# Patient Record
Sex: Female | Born: 1980 | Race: White | Hispanic: No | Smoking: Current every day smoker
Health system: Southern US, Community
[De-identification: ages and names within clinical notes are randomized; demographics above are authoritative.]

## PROBLEM LIST (undated history)

## (undated) DIAGNOSIS — I10 Essential (primary) hypertension: Secondary | ICD-10-CM

## (undated) DIAGNOSIS — F319 Bipolar disorder, unspecified: Secondary | ICD-10-CM

## (undated) DIAGNOSIS — F259 Schizoaffective disorder, unspecified: Secondary | ICD-10-CM

## (undated) DIAGNOSIS — R569 Unspecified convulsions: Secondary | ICD-10-CM

## (undated) DIAGNOSIS — G43909 Migraine, unspecified, not intractable, without status migrainosus: Secondary | ICD-10-CM

## (undated) DIAGNOSIS — J45909 Unspecified asthma, uncomplicated: Secondary | ICD-10-CM

## (undated) DIAGNOSIS — M797 Fibromyalgia: Secondary | ICD-10-CM

## (undated) DIAGNOSIS — Q282 Arteriovenous malformation of cerebral vessels: Secondary | ICD-10-CM

## (undated) DIAGNOSIS — I639 Cerebral infarction, unspecified: Secondary | ICD-10-CM

## (undated) HISTORY — DX: Schizoaffective disorder, unspecified: F25.9

## (undated) HISTORY — DX: Arteriovenous malformation of cerebral vessels: Q28.2

## (undated) HISTORY — DX: Bipolar disorder, unspecified: F31.9

## (undated) HISTORY — PX: BRAIN SURGERY: SHX531

## (undated) HISTORY — DX: Fibromyalgia: M79.7

## (undated) HISTORY — DX: Migraine, unspecified, not intractable, without status migrainosus: G43.909

---

## 2017-06-11 ENCOUNTER — Other Ambulatory Visit: Payer: Self-pay

## 2017-06-11 ENCOUNTER — Emergency Department (HOSPITAL_COMMUNITY)
Admission: EM | Admit: 2017-06-11 | Discharge: 2017-06-11 | Disposition: A | Discharge status: Home / Self Care | Payer: Medicaid Other | Attending: Emergency Medicine | Admitting: Emergency Medicine

## 2017-06-11 ENCOUNTER — Encounter (HOSPITAL_COMMUNITY): Payer: Self-pay

## 2017-06-11 DIAGNOSIS — I10 Essential (primary) hypertension: Secondary | ICD-10-CM | POA: Diagnosis not present

## 2017-06-11 DIAGNOSIS — Z760 Encounter for issue of repeat prescription: Secondary | ICD-10-CM | POA: Diagnosis not present

## 2017-06-11 DIAGNOSIS — Z79899 Other long term (current) drug therapy: Secondary | ICD-10-CM | POA: Insufficient documentation

## 2017-06-11 DIAGNOSIS — F17200 Nicotine dependence, unspecified, uncomplicated: Secondary | ICD-10-CM | POA: Insufficient documentation

## 2017-06-11 DIAGNOSIS — R569 Unspecified convulsions: Secondary | ICD-10-CM | POA: Insufficient documentation

## 2017-06-11 HISTORY — DX: Essential (primary) hypertension: I10

## 2017-06-11 HISTORY — DX: Unspecified convulsions: R56.9

## 2017-06-11 MED ORDER — GABAPENTIN 300 MG PO CAPS: 300.0000 mg | ORAL_CAPSULE | Freq: Three times a day (TID) | ORAL | 0 refills | Status: DC

## 2017-06-11 MED ORDER — LAMOTRIGINE 100 MG PO TABS: 100.0000 mg | ORAL_TABLET | Freq: Every day | ORAL | 0 refills | Status: DC

## 2017-06-11 MED FILL — lamoTRIgine 100 MG TABS: 100 | 30 days supply | Qty: 30 | Fill #0

## 2017-06-11 MED FILL — GABAPENTIN 300 MG CAPSULE: 300 | 30 days supply | Qty: 90 | Fill #0

## 2017-06-11 NOTE — Discharge Planning (Signed)
Shannon Castillo J. Lucretia RoersWood, RN, BSN, [place] [phone]  [facility] Sindy Messingoger Gomez, PA-C at [facility] on 06/22/17 @ 3:00.  Spoke with pt at bedside and advised to please arrive 15 min early and take a picture ID and your current medications.  Pt verbalizes understanding of keeping appointment.  [place] recently relocated [place] this area from [place]; [facility]; [place] [facility].

## 2017-06-11 NOTE — ED Provider Notes (Signed)
[facility] Provider Note   CSN: [id number] Arrival date & time: 06/11/17  1040     History   Chief Complaint Chief Complaint  Patient presents with  . Medication Refill    HPI Shannon Castillo is a 36 y.o. female who presents to the ED for medication refill. Patient reports that she has been out of her seizure medications for a week. She is supposed to take Lamictal and Neurontin. Patient reports she recently moved here and does not have a PCP yet. Patient denies seizures since being out of her medication.   HPI  Past Medical History:  Diagnosis Date  . Hypertension   . Seizures (HCC)     There are no active problems to display for this patient.   History reviewed. No pertinent surgical history.  OB History    No data available       Home Medications    Prior to Admission medications   Medication Sig Start Date End Date Taking? Authorizing Provider  amLODipine (NORVASC) 5 MG tablet Take 5 mg by mouth daily.   Yes [provider]  hydrOXYzine (ATARAX/VISTARIL) 50 MG tablet Take 50 mg by mouth every 6 (six) hours as needed for anxiety.   Yes [provider]  meloxicam (MOBIC) 15 MG tablet Take 15 mg by mouth 2 (two) times daily.   Yes [provider]  Potassium 75 MG TABS Take 1 tablet by mouth daily.   Yes [provider]  risperiDONE (RISPERDAL) 1 MG tablet Take 1 mg by mouth at bedtime.   Yes [provider]  gabapentin (NEURONTIN) 300 MG capsule Take 1 capsule (300 mg total) by mouth 3 (three) times daily. 06/11/17   Janne NapoleonNeese, Hope M, NP  lamoTRIgine (LAMICTAL) 100 MG tablet Take 1 tablet (100 mg total) by mouth daily. 06/11/17   Janne NapoleonNeese, Hope M, NP    Family History History reviewed. No pertinent family history.  Social History Social History   Tobacco Use  . Smoking status: Current Every Day Smoker    Packs/day: 0.50  Substance Use Topics  . Alcohol use: Yes    Comment: occ   . Drug use: Not on file     Allergies   Penicillins   Review of Systems Review of Systems  All other systems reviewed and are negative. patient denies having any problems but needs medication to prevent having a seizure.   Physical Exam Updated Vital Signs BP 109/75 (BP Location: Right Arm)   Pulse 65   Temp 97.6 F (36.4 C) (Oral)   Resp 16   LMP 06/10/2017 (Within Days)   SpO2 99%   Physical Exam  Constitutional: She is oriented to person, place, and time. She appears well-developed and well-nourished. No distress.  HENT:  Head: Normocephalic.  Eyes: EOM are normal.  Neck: Neck supple.  Cardiovascular: Normal rate.  Pulmonary/Chest: Effort normal.  Musculoskeletal: Normal range of motion.  Neurological: She is alert and oriented to person, place, and time.  Skin: Skin is warm and dry.  Psychiatric: She has a normal mood and affect. Her behavior is normal.  Nursing note and vitals reviewed.    ED Treatments / Results  Labs (all labs ordered are listed, but only abnormal results are displayed) Labs Reviewed - No data to display  Radiology No results found.  Procedures Procedures (including critical care time)  Medications Ordered in ED Medications - No data to display   Initial Impression / Assessment and Plan /  ED Course  I have reviewed the triage vital signs and the nursing notes. 36 [place]. [place] medication refill until she can see a PCP. Medication is for seizures. Medication refilled and patient given referral for PCP. Stable for d/c   Final Clinical Impressions(s) / ED Diagnoses   Final diagnoses:  Medication refill    ED Discharge Orders        Ordered    gabapentin (NEURONTIN) 300 MG capsule  3 times daily     06/11/17 1156    lamoTRIgine (LAMICTAL) 100 MG tablet  Daily     06/11/17 [address]       Neese, [place] M, NP 06/11/17 1827    Mancel BaleWentz, Elliott, MD 06/12/17 1921

## 2017-06-11 NOTE — Discharge Instructions (Signed)
[facility] to schedule follow up.

## 2017-06-11 NOTE — ED Triage Notes (Signed)
Pt states she is out of her seizure medications. States she takes lamictal and neurontin. Pt sates she just recently moved here and does not have a PCP to get her medications. Pt denies any seizures. Has been out of meds X1 week.

## 2017-06-22 ENCOUNTER — Ambulatory Visit (INDEPENDENT_AMBULATORY_CARE_PROVIDER_SITE_OTHER): Payer: Medicaid Other | Admitting: Physician Assistant

## 2017-07-13 ENCOUNTER — Other Ambulatory Visit: Payer: Self-pay

## 2017-07-13 ENCOUNTER — Emergency Department (HOSPITAL_COMMUNITY): Payer: Medicaid Other

## 2017-07-13 ENCOUNTER — Emergency Department (HOSPITAL_COMMUNITY)
Admission: EM | Admit: 2017-07-13 | Discharge: 2017-07-13 | Disposition: A | Discharge status: Home / Self Care | Payer: Medicaid Other | Attending: Emergency Medicine | Admitting: Emergency Medicine

## 2017-07-13 ENCOUNTER — Encounter (HOSPITAL_COMMUNITY): Payer: Self-pay

## 2017-07-13 DIAGNOSIS — J069 Acute upper respiratory infection, unspecified: Secondary | ICD-10-CM | POA: Insufficient documentation

## 2017-07-13 DIAGNOSIS — F17210 Nicotine dependence, cigarettes, uncomplicated: Secondary | ICD-10-CM | POA: Insufficient documentation

## 2017-07-13 DIAGNOSIS — I10 Essential (primary) hypertension: Secondary | ICD-10-CM | POA: Diagnosis not present

## 2017-07-13 DIAGNOSIS — Z79899 Other long term (current) drug therapy: Secondary | ICD-10-CM | POA: Insufficient documentation

## 2017-07-13 DIAGNOSIS — Z760 Encounter for issue of repeat prescription: Secondary | ICD-10-CM | POA: Insufficient documentation

## 2017-07-13 DIAGNOSIS — R05 Cough: Secondary | ICD-10-CM | POA: Diagnosis present

## 2017-07-13 MED ORDER — RISPERIDONE 1 MG PO TABS: 1.0000 mg | ORAL_TABLET | Freq: Every day | ORAL | 0 refills | Status: DC

## 2017-07-13 MED ORDER — LAMOTRIGINE 100 MG PO TABS: 100.0000 mg | ORAL_TABLET | Freq: Every day | ORAL | 0 refills | Status: DC

## 2017-07-13 MED ORDER — GABAPENTIN 300 MG PO CAPS: 300.0000 mg | ORAL_CAPSULE | Freq: Three times a day (TID) | ORAL | 0 refills | Status: DC

## 2017-07-13 MED ORDER — PROMETHAZINE-DM 6.25-15 MG/5ML PO SYRP: 5.0000 mL | ORAL_SOLUTION | Freq: Four times a day (QID) | ORAL | 0 refills | Status: DC | PRN

## 2017-07-13 MED ORDER — DEXAMETHASONE SODIUM PHOSPHATE 10 MG/ML IJ SOLN
10.0000 mg | Freq: Once | INTRAMUSCULAR | Status: AC
  Administered 2017-07-13: 10 mg via INTRAMUSCULAR
  Filled 2017-07-13: qty 1

## 2017-07-13 MED ORDER — FLUTICASONE PROPIONATE 50 MCG/ACT NA SUSP: 2.0000 | Freq: Every day | NASAL | 0 refills | Status: DC

## 2017-07-13 MED ORDER — IPRATROPIUM-ALBUTEROL 0.5-2.5 (3) MG/3ML IN SOLN
3.0000 mL | Freq: Once | RESPIRATORY_TRACT | Status: AC
Start: 2017-07-13 — End: 2017-07-13
  Administered 2017-07-13: 3 mL via RESPIRATORY_TRACT
  Filled 2017-07-13: qty 3

## 2017-07-13 NOTE — ED Provider Notes (Signed)
[facility] EMERGENCY DEPARTMENT Provider Note   CSN: [id number] Arrival date & time: 07/13/17  1551     History   Chief Complaint Chief Complaint  Patient presents with  . Cough  . Medication Refill    HPI Shannon Castillo is a 37 y.o. female presenting for evaluation of cough, nasal congestion, and fevers.  She states that for the past week, she has had URI symptoms including cough, nasal congestion, and fevers.  Initially she had fevers every day, but this has improved in the past several days.  Her cough is productive, worse at night.  She took NyQuil without improvement of symptoms.  She has a history of asthma, has been using her inhaler more often than normal, but reports no improvement of her symptoms after inhaler use.  She is concerned she might have pneumonia.  Her husband has similar URI symptoms.  She denies sinus pain or pressure, ear pain, sore throat, chest pain, difficulty breathing, nausea, vomiting, abdominal pain. Additionally, patient reports she needs a refill of her antiseizure medicines.  This includes gabapentin, Risperdal, and Lamictal.  She recently moved from [place] couple months ago.  She had these refilled last month in the ER, an appointment was set up with R Lily Kocher, PA-C, but she states she lost her paperwork and could not go.  She states she does not need any of her other medicines refilled.  She denies any seizures, ran out of her medicines today.  HPI  Past Medical History:  Diagnosis Date  . Hypertension   . Seizures (HCC)     There are no active problems to display for this patient.   History reviewed. No pertinent surgical history.  OB History    No data available       Home Medications    Prior to Admission medications   Medication Sig Start Date End Date Taking? Authorizing Provider  amLODipine (NORVASC) 5 MG tablet Take 5 mg by mouth daily.    [provider]  fluticasone (FLONASE) 50 MCG/ACT nasal  spray Place 2 sprays into both nostrils daily. 07/13/17   Lasharn Bufkin, PA-C  gabapentin (NEURONTIN) 300 MG capsule Take 1 capsule (300 mg total) by mouth 3 (three) times daily for 14 days. 07/13/17 07/27/17  Dereonna Lensing, PA-C  hydrOXYzine (ATARAX/VISTARIL) 50 MG tablet Take 50 mg by mouth every 6 (six) hours as needed for anxiety.    [provider]  lamoTRIgine (LAMICTAL) 100 MG tablet Take 1 tablet (100 mg total) by mouth daily for 14 days. 07/13/17 07/27/17  Bassheva Flury, PA-C  meloxicam (MOBIC) 15 MG tablet Take 15 mg by mouth 2 (two) times daily.    [provider]  Potassium 75 MG TABS Take 1 tablet by mouth daily.    [provider]  promethazine-dextromethorphan (PROMETHAZINE-DM) 6.25-15 MG/5ML syrup Take 5 mLs by mouth 4 (four) times daily as needed for cough. 07/13/17   Teodor Prater, PA-C  risperiDONE (RISPERDAL) 1 MG tablet Take 1 tablet (1 mg total) by mouth at bedtime for 14 days. 07/13/17 07/27/17  Ronrico Dupin, PA-C    Family History History reviewed. No pertinent family history.  Social History Social History   Tobacco Use  . Smoking status: Current Every Day Smoker    Packs/day: 0.50  . Smokeless tobacco: Never Used  Substance Use Topics  . Alcohol use: Yes    Comment: occ  . Drug use: Not on file     Allergies   Penicillins  Review of Systems Review of Systems  Constitutional: Positive for fever (improving).  HENT: Positive for congestion. Negative for sinus pressure, sinus pain and tinnitus.   Eyes: Negative for pain and itching.  Respiratory: Positive for cough. Negative for shortness of breath.   Cardiovascular: Negative for chest pain.  Gastrointestinal: Negative for abdominal pain, nausea and vomiting.  Genitourinary: Negative for dysuria, frequency and hematuria.  Musculoskeletal: Negative for back pain.  Allergic/Immunologic: Negative for immunocompromised state.  Neurological: Negative for dizziness.    Hematological: Does not bruise/bleed easily.  Psychiatric/Behavioral: Negative for confusion.     Physical Exam Updated Vital Signs BP 122/85 (BP Location: Right Arm)   Pulse 73   Temp 98 F (36.7 C) (Oral)   Resp 18   Ht 5\' 4"  (1.626 m)   Wt 80.3 kg (177 lb)   LMP 07/13/2017   SpO2 98%   BMI 30.38 kg/m   Physical Exam  Constitutional: She is oriented to person, place, and time. She appears well-developed and well-nourished. No distress.  HENT:  Head: Normocephalic and atraumatic.  Right Ear: Tympanic membrane, external ear and ear canal normal.  Left Ear: Tympanic membrane, external ear and ear canal normal.  Nose: Mucosal edema present. Right sinus exhibits no maxillary sinus tenderness and no frontal sinus tenderness. Left sinus exhibits no maxillary sinus tenderness and no frontal sinus tenderness.  Mouth/Throat: Uvula is midline, oropharynx is clear and moist and mucous membranes are normal. No tonsillar exudate.  Nasal mucosal edema.  OP clear without tonsillar swelling or exudate.  TMs nonerythematous and not bulging bilaterally.  No sinus pain or pressure.  Eyes: Conjunctivae and EOM are normal. Pupils are equal, round, and reactive to light.  Neck: Normal range of motion.  Cardiovascular: Normal rate, regular rhythm and intact distal pulses.  Pulmonary/Chest: Effort normal. She has no decreased breath sounds. She has wheezes. She has no rhonchi. She has no rales.  Pt speaking in full sentences without difficulty.  Wheezes heard in all fields.  No rales or rhonchi.  Good air movement.  Abdominal: Soft. She exhibits no distension. There is no tenderness.  Musculoskeletal: Normal range of motion.  Lymphadenopathy:    She has no cervical adenopathy.  Neurological: She is alert and oriented to person, place, and time.  Skin: Skin is warm.  Psychiatric: She has a normal mood and affect.  Nursing note and vitals reviewed.    ED Treatments / Results  Labs (all labs  ordered are listed, but only abnormal results are displayed) Labs Reviewed - No data to display  EKG  EKG Interpretation None       Radiology Dg Chest 2 View  Result Date: 07/13/2017 CLINICAL DATA:  Cough, fever EXAM: CHEST  2 VIEW COMPARISON:  None. FINDINGS: Heart and mediastinal contours are within normal limits. No focal opacities or effusions. No acute bony abnormality. IMPRESSION: No active cardiopulmonary disease. Electronically Signed   By: Charlett NoseKevin  Dover M.D.   On: 07/13/2017 19:07    Procedures Procedures (including critical care time)  Medications Ordered in ED Medications  ipratropium-albuterol (DUONEB) 0.5-2.5 (3) MG/3ML nebulizer solution 3 mL (3 mLs Nebulization Given 07/13/17 1750)  dexamethasone (DECADRON) injection 10 mg (10 mg Intramuscular Given 07/13/17 1932)     Initial Impression / Assessment and Plan / ED Course  I have reviewed the triage vital signs and the nursing notes.  Pertinent labs & imaging results that were available during my care of the patient were reviewed by me and considered in  my medical decision making (see chart for details).     Patient presenting with 1 wk URI symptoms.  Physical exam reassuring, patient is afebrile and appears nontoxic.  Pulmonary exam reassuring with scattered wheezing.  Doubt pneumonia, strep, other bacterial infection, or peritonsillar abscess. CXR negative.  Likely viral URI.  Wheezing resolved with duoneb tx, pt reports no change in sxs.  Patient requests Decadron shot without prednisone prescription.  Will d/c with symptomatic tx. additionally, patient requesting refills of her antiseizure medications.  Discussed that the ER is not a place for medication refill, however I will refill for 2 weeks to ensure she does not have a seizure.  Stressed importance of primary care follow-up.  At this time, patient appears safe for discharge.  Return precautions given.  Patient states she understands and agrees to plan.   Final  Clinical Impressions(s) / ED Diagnoses   Final diagnoses:  Upper respiratory tract infection, unspecified type    ED Discharge Orders        Ordered    fluticasone (FLONASE) 50 MCG/ACT nasal spray  Daily     07/13/17 1945    promethazine-dextromethorphan (PROMETHAZINE-DM) 6.25-15 MG/5ML syrup  4 times daily PRN     07/13/17 1945    gabapentin (NEURONTIN) 300 MG capsule  3 times daily     07/13/17 1945    lamoTRIgine (LAMICTAL) 100 MG tablet  Daily     07/13/17 1945    risperiDONE (RISPERDAL) 1 MG tablet  Daily at bedtime     07/13/17 1945       Alveria Apley, PA-C 07/13/17 2004    Nira Conn, MD 07/13/17 2109

## 2017-07-13 NOTE — Discharge Instructions (Signed)
It is very important that you establish primary care. There is information below for 2 clinics, or you may contact the [phone] number in the back of the paperwork to help.  You likely have a viral illness.  This should be treated symptomatically. Use Tylenol or ibuprofen as needed for fevers or body aches. Use Flonase daily for nasal congestion and cough. Use you inhaler every 4 hours for the next 2 days.  Use cough syrup as needed.  Make sure you stay well-hydrated with water. Wash your hands frequently to prevent spread of infection. Follow-up with your primary care doctor in 1 week if your symptoms are not improving. Return to the emergency room if you develop chest pain, difficulty breathing, or any new or worsening symptoms.

## 2017-07-13 NOTE — ED Notes (Signed)
Pt to xray

## 2017-07-13 NOTE — ED Triage Notes (Signed)
Pt states she has had a cold. Cough, and congestion. States coughing up mucous. She states she is also out of her medications, lamictal, neurontin, risperdone.

## 2017-08-21 ENCOUNTER — Other Ambulatory Visit: Payer: Self-pay

## 2017-08-21 ENCOUNTER — Emergency Department (HOSPITAL_COMMUNITY)
Admission: EM | Admit: 2017-08-21 | Discharge: 2017-08-21 | Disposition: A | Discharge status: Home / Self Care | Payer: Medicaid Other | Attending: Emergency Medicine | Admitting: Emergency Medicine

## 2017-08-21 ENCOUNTER — Emergency Department (HOSPITAL_COMMUNITY): Payer: Medicaid Other

## 2017-08-21 ENCOUNTER — Encounter (HOSPITAL_COMMUNITY): Payer: Self-pay | Admitting: Emergency Medicine

## 2017-08-21 DIAGNOSIS — I10 Essential (primary) hypertension: Secondary | ICD-10-CM | POA: Diagnosis not present

## 2017-08-21 DIAGNOSIS — J40 Bronchitis, not specified as acute or chronic: Secondary | ICD-10-CM | POA: Diagnosis not present

## 2017-08-21 DIAGNOSIS — F17200 Nicotine dependence, unspecified, uncomplicated: Secondary | ICD-10-CM | POA: Diagnosis not present

## 2017-08-21 DIAGNOSIS — Z79899 Other long term (current) drug therapy: Secondary | ICD-10-CM | POA: Diagnosis not present

## 2017-08-21 DIAGNOSIS — R05 Cough: Secondary | ICD-10-CM | POA: Diagnosis present

## 2017-08-21 MED ORDER — AEROCHAMBER Z-STAT PLUS/MEDIUM MISC
1.0000 | Freq: Once | Status: AC
  Administered 2017-08-21: 1
  Filled 2017-08-21 (×2): qty 1

## 2017-08-21 MED ORDER — PREDNISONE 10 MG (21) PO TBPK: ORAL_TABLET | Freq: Every day | ORAL | 0 refills | Status: DC

## 2017-08-21 MED ORDER — BENZONATATE 100 MG PO CAPS: 100.0000 mg | ORAL_CAPSULE | Freq: Three times a day (TID) | ORAL | 0 refills | Status: DC

## 2017-08-21 NOTE — Discharge Instructions (Signed)
Use inhaler with AeroChamber every 4 hours.  Take prednisone as prescribed until all gone.  Continue Flonase nasal spray daily.  Take Tessalon as needed for cough.  Please follow-up with family doctor if not improving in 3 days.

## 2017-08-21 NOTE — ED Triage Notes (Signed)
Pt to ER for re-evaluation of URI symptoms x1 month. States was given antibiotics and it has not subsided. States productive cough and nasal congestion.

## 2017-08-21 NOTE — ED Provider Notes (Signed)
[facility] EMERGENCY DEPARTMENT Provider Note   CSN: [id number] Arrival date & time: 08/21/17  1046     History   Chief Complaint Chief Complaint  Patient presents with  . URI    HPI Shannon Castillo is a 37 y.o. female.  HPI Shannon Castillo is a 37 y.o. female presents to emergency department with complaint of nasal congestion, cough, shortness of breath.  Patient states her symptoms initially began about 1 month ago.  She was initially seen here when her symptoms began on 07/13/17.  She was prescribed nasal spray, Flonase, and promethazine DM for cough.  She states since then she has seen her primary care doctor who put her on a course of "strong antibiotic" which she finished about 2 weeks ago.  She states nothing she is doing is helping.  She continues to have productive cough.  She states she is having trouble sleeping due to cough and shortness of breath.  She continues to have nasal congestion.  She denies any fever or chills.  No sore throat.  No nausea, vomiting, diarrhea.  She is currently not taking any medicines other than inhalers which she uses twice a day.  Past Medical History:  Diagnosis Date  . Hypertension   . Seizures (HCC)     There are no active problems to display for this patient.   History reviewed. No pertinent surgical history.  OB History    No data available       Home Medications    Prior to Admission medications   Medication Sig Start Date End Date Taking? Authorizing Provider  amLODipine (NORVASC) 5 MG tablet Take 5 mg by mouth daily.    [provider]  fluticasone (FLONASE) 50 MCG/ACT nasal spray Place 2 sprays into both nostrils daily. 07/13/17   Caccavale, Sophia, PA-C  gabapentin (NEURONTIN) 300 MG capsule Take 1 capsule (300 mg total) by mouth 3 (three) times daily for 14 days. 07/13/17 07/27/17  Caccavale, Sophia, PA-C  hydrOXYzine (ATARAX/VISTARIL) 50 MG tablet Take 50 mg by mouth every 6 (six) hours as needed  for anxiety.    [provider]  lamoTRIgine (LAMICTAL) 100 MG tablet Take 1 tablet (100 mg total) by mouth daily for 14 days. 07/13/17 07/27/17  Caccavale, Sophia, PA-C  meloxicam (MOBIC) 15 MG tablet Take 15 mg by mouth 2 (two) times daily.    [provider]  Potassium 75 MG TABS Take 1 tablet by mouth daily.    [provider]  promethazine-dextromethorphan (PROMETHAZINE-DM) 6.25-15 MG/5ML syrup Take 5 mLs by mouth 4 (four) times daily as needed for cough. 07/13/17   Caccavale, Sophia, PA-C  risperiDONE (RISPERDAL) 1 MG tablet Take 1 tablet (1 mg total) by mouth at bedtime for 14 days. 07/13/17 07/27/17  Caccavale, Sophia, PA-C    Family History History reviewed. No pertinent family history.  Social History Social History   Tobacco Use  . Smoking status: Current Every Day Smoker    Packs/day: 0.50  . Smokeless tobacco: Never Used  Substance Use Topics  . Alcohol use: Yes    Comment: occ  . Drug use: Not on file     Allergies   Penicillins   Review of Systems Review of Systems  Constitutional: Negative for chills and fever.  HENT: Positive for congestion and sore throat.   Respiratory: Positive for cough and shortness of breath. Negative for chest tightness.   Cardiovascular: Negative for chest pain, palpitations and leg swelling.  Gastrointestinal: Negative for abdominal  pain, diarrhea, nausea and vomiting.  Genitourinary: Negative for dysuria, flank pain and pelvic pain.  Musculoskeletal: Negative for arthralgias, myalgias, neck pain and neck stiffness.  Skin: Negative for rash.  Neurological: Negative for dizziness, weakness and headaches.  All other systems reviewed and are negative.    Physical Exam Updated Vital Signs BP 122/85 (BP Location: Right Arm)   Pulse 84   Temp 97.8 F (36.6 C) (Oral)   Resp 16   Ht 5\' 4"  (1.626 m)   Wt 81.6 kg (180 lb)   LMP 08/03/2017 (Approximate)   SpO2 98%   BMI 30.90 kg/m   Physical Exam    Constitutional: She is oriented to person, place, and time. She appears well-developed and well-nourished. No distress.  HENT:  Head: Normocephalic.  Mouth/Throat: Oropharynx is clear and moist.  Nasal congestion present  Eyes: Conjunctivae are normal.  Neck: Neck supple.  Cardiovascular: Normal rate, regular rhythm and normal heart sounds.  Pulmonary/Chest: Effort normal. No respiratory distress. She has wheezes. She has no rales.  Expiratory wheezes in both lung fields  Abdominal: Soft. Bowel sounds are normal. She exhibits no distension. There is no tenderness. There is no rebound.  Musculoskeletal: She exhibits no edema.  Neurological: She is alert and oriented to person, place, and time.  Skin: Skin is warm and dry.  Psychiatric: She has a normal mood and affect. Her behavior is normal.  Nursing note and vitals reviewed.    ED Treatments / Results  Labs (all labs ordered are listed, but only abnormal results are displayed) Labs Reviewed - No data to display  EKG  EKG Interpretation None       Radiology Dg Chest 2 View  Result Date: 08/21/2017 CLINICAL DATA:  Productive cough for 1 month. EXAM: CHEST - 2 VIEW COMPARISON:  07/13/2017 FINDINGS: The heart size and mediastinal contours are within normal limits. Both lungs are clear. The visualized skeletal structures are unremarkable. IMPRESSION: Negative.  No active cardiopulmonary disease. Electronically Signed   By: Myles RosenthalJohn  Stahl M.D.   On: 08/21/2017 11:39    Procedures Procedures (including critical care time)  Medications Ordered in ED Medications - No data to display   Initial Impression / Assessment and Plan / ED Course  I have reviewed the triage vital signs and the nursing notes.  Pertinent labs & imaging results that were available during my care of the patient were reviewed by me and considered in my medical decision making (see chart for details).    Patient in emergency department with upper respiratory  symptoms.  She states symptoms have been going on for over a month.  She is wheezing, has nasal congestion, otherwise unremarkable examination.  Vital signs all within normal.  She is not hypoxic, she is afebrile.  Most likely bronchitis.  Discussed smoking cessation.  Will start on prednisone course, cough medication, increase albuterol inhaler to every 3-4 hours.  Will provide with an AeroChamber.  We will have her follow-up with family doctor in 3 days if not improving.  Chest x-ray negative.  Vitals:   08/21/17 1058  BP: 122/85  Pulse: 84  Resp: 16  Temp: 97.8 F (36.6 C)  TempSrc: Oral  SpO2: 98%  Weight: 81.6 kg (180 lb)  Height: 5\' 4"  (1.626 m)    Final Clinical Impressions(s) / ED Diagnoses   Final diagnoses:  Bronchitis    ED Discharge Orders        Ordered    predniSONE (STERAPRED UNI-PAK 21 TAB) 10 MG (  21) TBPK tablet  Daily     08/21/17 1232    benzonatate (TESSALON) 100 MG capsule  Every 8 hours     08/21/17 1232       Jaynie Crumble, PA-C 08/21/17 1527    Charlynne Pander, MD 08/24/17 [phone]

## 2017-10-03 ENCOUNTER — Encounter (HOSPITAL_COMMUNITY): Payer: Self-pay

## 2017-10-03 ENCOUNTER — Emergency Department (HOSPITAL_COMMUNITY): Payer: Medicaid Other

## 2017-10-03 ENCOUNTER — Emergency Department (HOSPITAL_COMMUNITY)
Admission: EM | Admit: 2017-10-03 | Discharge: 2017-10-03 | Disposition: A | Discharge status: Home / Self Care | Payer: Medicaid Other | Attending: Emergency Medicine | Admitting: Emergency Medicine

## 2017-10-03 ENCOUNTER — Other Ambulatory Visit: Payer: Self-pay

## 2017-10-03 DIAGNOSIS — F17210 Nicotine dependence, cigarettes, uncomplicated: Secondary | ICD-10-CM | POA: Insufficient documentation

## 2017-10-03 DIAGNOSIS — Z79899 Other long term (current) drug therapy: Secondary | ICD-10-CM | POA: Insufficient documentation

## 2017-10-03 DIAGNOSIS — I10 Essential (primary) hypertension: Secondary | ICD-10-CM | POA: Diagnosis not present

## 2017-10-03 DIAGNOSIS — R51 Headache: Secondary | ICD-10-CM | POA: Diagnosis not present

## 2017-10-03 DIAGNOSIS — R519 Headache, unspecified: Secondary | ICD-10-CM

## 2017-10-03 LAB — I-STAT BETA HCG BLOOD, ED (MC, WL, AP ONLY)

## 2017-10-03 LAB — COMPREHENSIVE METABOLIC PANEL
ALBUMIN: 3.7 g/dL (ref 3.5–5.0)
ALK PHOS: 77 U/L (ref 38–126)
ALT: 18 U/L (ref 14–54)
AST: 18 U/L (ref 15–41)
Anion gap: 10 (ref 5–15)
BUN: 8 mg/dL (ref 6–20)
CALCIUM: 9 mg/dL (ref 8.9–10.3)
CHLORIDE: 107 mmol/L (ref 101–111)
CO2: 19 mmol/L — AB (ref 22–32)
CREATININE: 0.86 mg/dL (ref 0.44–1.00)
GFR calc Af Amer: >60 mL/min (ref >60)
GFR calc non Af Amer: >60 mL/min (ref >60)
GLUCOSE: 80 mg/dL (ref 65–99)
Potassium: 4 mmol/L (ref 3.5–5.1)
SODIUM: 136 mmol/L (ref 135–145)
Total Bilirubin: 0.2 mg/dL — ABNORMAL LOW (ref 0.3–1.2)
Total Protein: 6.8 g/dL (ref 6.5–8.1)

## 2017-10-03 LAB — CBC WITH DIFFERENTIAL/PLATELET
BASOS PCT: 0 %
Basophils Absolute: 0 10*3/uL (ref 0.0–0.1)
EOS ABS: 0.1 10*3/uL (ref 0.0–0.7)
Eosinophils Relative: 1 %
HCT: 41.1 % (ref 36.0–46.0)
HEMOGLOBIN: 13.9 g/dL (ref 12.0–15.0)
Lymphocytes Relative: 32 %
Lymphs Abs: 2.5 10*3/uL (ref 0.7–4.0)
MCH: 29 pg (ref 26.0–34.0)
MCHC: 33.8 g/dL (ref 30.0–36.0)
MCV: 85.6 fL (ref 78.0–100.0)
MONO ABS: 0.3 10*3/uL (ref 0.1–1.0)
MONOS PCT: 4 %
Neutro Abs: 4.9 10*3/uL (ref 1.7–7.7)
Neutrophils Relative %: 63 %
Platelets: 386 10*3/uL (ref 150–400)
RBC: 4.8 MIL/uL (ref 3.87–5.11)
RDW: 13 % (ref 11.5–15.5)
WBC: 7.8 10*3/uL (ref 4.0–10.5)

## 2017-10-03 MED ORDER — SODIUM CHLORIDE 0.9 % IV BOLUS
1000.0000 mL | Freq: Once | INTRAVENOUS | Status: AC
  Administered 2017-10-03: 1000 mL via INTRAVENOUS

## 2017-10-03 MED ORDER — BUTALBITAL-APAP-CAFFEINE 50-325-40 MG PO TABS
1.0000 | ORAL_TABLET | Freq: Four times a day (QID) | ORAL | 0 refills | Status: AC | PRN
Start: 2017-10-03 — End: 2018-10-03

## 2017-10-03 MED ORDER — METOCLOPRAMIDE HCL 5 MG/ML IJ SOLN
10.0000 mg | Freq: Once | INTRAMUSCULAR | Status: AC
  Administered 2017-10-03: 10 mg via INTRAVENOUS
  Filled 2017-10-03: qty 2

## 2017-10-03 MED ORDER — DIPHENHYDRAMINE HCL 50 MG/ML IJ SOLN
25.0000 mg | Freq: Once | INTRAMUSCULAR | Status: AC
  Administered 2017-10-03: 25 mg via INTRAVENOUS
  Filled 2017-10-03: qty 1

## 2017-10-03 MED ORDER — KETOROLAC TROMETHAMINE 30 MG/ML IJ SOLN
30.0000 mg | Freq: Once | INTRAMUSCULAR | Status: AC
  Administered 2017-10-03: 30 mg via INTRAVENOUS
  Filled 2017-10-03: qty 1

## 2017-10-03 NOTE — ED Triage Notes (Signed)
Pt reports migraine X6 days with NVD. Pt reports she has not had migraine since 2007 when she had brain surgery.

## 2017-10-03 NOTE — ED Notes (Signed)
Gave Pt bus passes

## 2017-10-03 NOTE — Discharge Instructions (Addendum)
Please take fioricet as needed for your headache.  Call and follow up closely with neurologist for further management of your condition. Return if you have any concerns.

## 2017-10-03 NOTE — ED Provider Notes (Signed)
[facility] EMERGENCY DEPARTMENT Provider Note   CSN: [id number] Arrival date & time: 10/03/17  1320     History   Chief Complaint Chief Complaint  Patient presents with  . Migraine    HPI Shannon Castillo is a 37 y.o. female.  HPI   37 year old female with history of seizures presenting complaining of headache.  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  She has not had any headache since however for the past week she is having persistent headache.  She described as a gradual onset stabbing nonradiating pain to her right parietal region rates as 7 out of 10 currently.  She also endorsed bilateral blurry vision, feeling nauseous, vomited several times as well as having loose stools on a daily basis.  She does not feel well.  She endorsed occasional chills.  She denies any associated fever, loss of vision, confusion, neck pain, chest pain, trouble breathing, focal numbness or weakness, or rash.  She is a smoker.  Her surgery was done in [place].  She denies any recent seizure activity.  Past Medical History:  Diagnosis Date  . Hypertension   . Seizures (HCC)     There are no active problems to display for this patient.   History reviewed. No pertinent surgical history.   OB History   None      Home Medications    Prior to Admission medications   Medication Sig Start Date End Date Taking? Authorizing Provider  amLODipine (NORVASC) 5 MG tablet Take 5 mg by mouth daily.    [provider]  benzonatate (TESSALON) 100 MG capsule Take 1 capsule (100 mg total) by mouth every 8 (eight) hours. 08/21/17   Kirichenko, Tatyana, PA-C  fluticasone (FLONASE) 50 MCG/ACT nasal spray Place 2 sprays into both nostrils daily. 07/13/17   Caccavale, Sophia, PA-C  gabapentin (NEURONTIN) 300 MG capsule Take 1 capsule (300 mg total) by mouth 3 (three) times daily for 14 days. 07/13/17 07/27/17  Caccavale, Sophia,  PA-C  hydrOXYzine (ATARAX/VISTARIL) 50 MG tablet Take 50 mg by mouth every 6 (six) hours as needed for anxiety.    [provider]  lamoTRIgine (LAMICTAL) 100 MG tablet Take 1 tablet (100 mg total) by mouth daily for 14 days. 07/13/17 07/27/17  Caccavale, Sophia, PA-C  meloxicam (MOBIC) 15 MG tablet Take 15 mg by mouth 2 (two) times daily.    [provider]  Potassium 75 MG TABS Take 1 tablet by mouth daily.    [provider]  predniSONE (STERAPRED UNI-PAK 21 TAB) 10 MG (21) TBPK tablet Take by mouth daily. Take 6 tabs by mouth daily  for 2 days, then 5 tabs for 2 days, then 4 tabs for 2 days, then 3 tabs for 2 days, 2 tabs for 2 days, then 1 tab by mouth daily for 2 days 08/21/17   Jaynie Crumble, PA-C  promethazine-dextromethorphan (PROMETHAZINE-DM) 6.25-15 MG/5ML syrup Take 5 mLs by mouth 4 (four) times daily as needed for cough. 07/13/17   Caccavale, Sophia, PA-C  risperiDONE (RISPERDAL) 1 MG tablet Take 1 tablet (1 mg total) by mouth at bedtime for 14 days. 07/13/17 07/27/17  Caccavale, Sophia, PA-C    Family History No family history on file.  Social History Social History   Tobacco Use  . Smoking status: Current Every Day Smoker    Packs/day: 0.50  . Smokeless tobacco: Never Used  Substance Use Topics  . Alcohol use:  Yes    Comment: occ  . Drug use: Yes    Types: Marijuana     Allergies   Penicillins   Review of Systems Review of Systems  All other systems reviewed and are negative.    Physical Exam Updated Vital Signs BP (!) 158/107 (BP Location: Right Arm)   Pulse 80   Temp 98.1 F (36.7 C) (Oral)   Resp 16   Ht 5\' 4"  (1.626 m)   Wt 82.1 kg (181 lb)   LMP 10/01/2017   SpO2 99%   BMI 31.07 kg/m   Physical Exam  Constitutional: She is oriented to person, place, and time. She appears well-developed and well-nourished. No distress.  HENT:  Head: Normocephalic and atraumatic.  Eyes: Pupils are equal, round, and reactive to light.  Conjunctivae and EOM are normal.  Neck: Normal range of motion. Neck supple.  No nuchal rigidity  Cardiovascular: Normal rate and regular rhythm.  Pulmonary/Chest: Effort normal and breath sounds normal.  Abdominal: Soft. She exhibits no distension.  Neurological: She is alert and oriented to person, place, and time.  Neurologic exam:  Speech clear, pupils equal round reactive to light, extraocular movements intact  Normal peripheral visual fields Cranial nerves III through XII normal including no facial droop Follows commands, moves all extremities x4, normal strength to bilateral upper and lower extremities at all major muscle groups including grip Sensation normal to light touch and pinprick Coordination intact, no limb ataxia, finger-nose-finger normal Rapid alternating movements normal No pronator drift Gait normal   Skin: No rash noted.  Psychiatric: She has a normal mood and affect.  Nursing note and vitals reviewed.    ED Treatments / Results  Labs (all labs ordered are listed, but only abnormal results are displayed) Labs Reviewed  COMPREHENSIVE METABOLIC PANEL - Abnormal; Notable for the following components:      Result Value   CO2 19 (*)    Total Bilirubin 0.2 (*)    All other components within normal limits  CBC WITH DIFFERENTIAL/PLATELET  I-STAT BETA HCG BLOOD, ED ([facility], [facility], [facility] ONLY)    EKG None  Radiology Ct Head Wo Contrast  Result Date: 10/03/2017 CLINICAL DATA:  Headache for 6 days with nausea, vomiting, and diarrhea. History of AVM repair. EXAM: CT HEAD WITHOUT CONTRAST TECHNIQUE: Contiguous axial images were obtained from the base of the skull through the vertex without intravenous contrast. COMPARISON:  None. FINDINGS: Brain: Sequelae of AVM repair are identified with embolization material in the right cerebral hemisphere, predominantly in the temporo-occipital region. This results in prominent streak artifacts which limits assessment. Extensive right  temporal lobe encephalomalacia is present with ex vacuo enlargement of the right lateral ventricle. Gliosis is also noted in the right external capsule. No acute large territory infarct, acute intracranial hemorrhage, or gross mass is identified. There is trace rightward midline shift due to cerebral volume loss. Trace asymmetric low-density extra-axial fluid over the right cerebral convexity may be related to prior surgery and or cerebral volume loss and is not felt to be clinically significant. Vascular: Post AVM repair changes. Skull: Right-sided Terri on all craniotomy. Sinuses/Orbits: Visualized paranasal sinuses and mastoid air cells are clear. Orbits are unremarkable. Other: None. IMPRESSION: 1. No definite acute intracranial abnormality. 2. Prior AVM repair and extensive right temporal encephalomalacia. Electronically Signed   By: Sebastian AcheAllen  Grady M.D.   On: 10/03/2017 15:55    Procedures Procedures (including critical care time)  Medications Ordered in ED Medications  sodium chloride 0.9 %  bolus 1,000 mL (0 mLs Intravenous Stopped 10/03/17 1713)  diphenhydrAMINE (BENADRYL) injection 25 mg (25 mg Intravenous Given 10/03/17 1608)  metoCLOPramide (REGLAN) injection 10 mg (10 mg Intravenous Given 10/03/17 1608)  ketorolac (TORADOL) 30 MG/ML injection 30 mg (30 mg Intravenous Given 10/03/17 1737)     Initial Impression / Assessment and Plan / ED Course  I have reviewed the triage vital signs and the nursing notes.  Pertinent labs & imaging results that were available during my care of the patient were reviewed by me and considered in my medical decision making (see chart for details).     BP (!) 158/107 (BP Location: Right Arm)   Pulse 80   Temp 98.1 F (36.7 C) (Oral)   Resp 16   Ht 5\' 4"  (1.626 m)   Wt 82.1 kg (181 lb)   LMP 10/01/2017   SpO2 99%   BMI 31.07 kg/m    Final Clinical Impressions(s) / ED Diagnoses   Final diagnoses:  Bad headache    ED Discharge Orders         Ordered    butalbital-acetaminophen-caffeine (FIORICET, ESGIC) 50-325-40 MG tablet  Every 6 hours PRN     10/03/17 1822     3:25 PM Patient with history of AVM in the brain required surgical repair in 2007 now presenting with new onset of headache at the surgical site which she is concerned about.  He has been an ongoing issue for the past week.  She has no focal neuro deficit on exam.  She is afebrile, no nuchal rigidity.  Will obtain head CT scan, given cocktail, and check pregnancy test.  Patient otherwise well-appearing.  Mentating appropriately.  6:25 PM Patient report improvement of symptoms after receiving migraine cocktail.  Her labs are reassuring.  Head CT scan unremarkable.  Patient will be discharged home to follow-up with neurology for further evaluation of her condition.   Fayrene Helper, PA-C 10/03/17 Maisie Fus, MD 10/03/17 [phone]

## 2017-10-07 ENCOUNTER — Emergency Department (HOSPITAL_COMMUNITY)
Admission: EM | Admit: 2017-10-07 | Discharge: 2017-10-07 | Disposition: A | Discharge status: Home / Self Care | Payer: Medicaid Other | Attending: Emergency Medicine | Admitting: Emergency Medicine

## 2017-10-07 ENCOUNTER — Encounter (HOSPITAL_COMMUNITY): Payer: Self-pay

## 2017-10-07 ENCOUNTER — Other Ambulatory Visit: Payer: Self-pay

## 2017-10-07 DIAGNOSIS — G43909 Migraine, unspecified, not intractable, without status migrainosus: Secondary | ICD-10-CM | POA: Insufficient documentation

## 2017-10-07 DIAGNOSIS — I10 Essential (primary) hypertension: Secondary | ICD-10-CM | POA: Insufficient documentation

## 2017-10-07 DIAGNOSIS — Z7982 Long term (current) use of aspirin: Secondary | ICD-10-CM | POA: Insufficient documentation

## 2017-10-07 DIAGNOSIS — Z79899 Other long term (current) drug therapy: Secondary | ICD-10-CM | POA: Diagnosis not present

## 2017-10-07 DIAGNOSIS — F17210 Nicotine dependence, cigarettes, uncomplicated: Secondary | ICD-10-CM | POA: Insufficient documentation

## 2017-10-07 MED ORDER — METOCLOPRAMIDE HCL 10 MG PO TABS
5.0000 mg | ORAL_TABLET | Freq: Once | ORAL | Status: AC
  Administered 2017-10-07: 5 mg via ORAL
  Filled 2017-10-07: qty 1

## 2017-10-07 MED ORDER — KETOROLAC TROMETHAMINE 30 MG/ML IJ SOLN
30.0000 mg | Freq: Once | INTRAMUSCULAR | Status: AC
  Administered 2017-10-07: 30 mg via INTRAMUSCULAR
  Filled 2017-10-07: qty 1

## 2017-10-07 MED ORDER — DIPHENHYDRAMINE HCL 25 MG PO CAPS
25.0000 mg | ORAL_CAPSULE | Freq: Once | ORAL | Status: AC
  Administered 2017-10-07: 25 mg via ORAL
  Filled 2017-10-07: qty 1

## 2017-10-07 MED ORDER — BUTALBITAL-APAP-CAFFEINE 50-325-40 MG PO TABS
1.0000 | ORAL_TABLET | Freq: Once | ORAL | Status: AC
  Administered 2017-10-07: 1 via ORAL
  Filled 2017-10-07: qty 1

## 2017-10-07 NOTE — Discharge Instructions (Addendum)
Please read attached information. If you experience any new or worsening signs or symptoms please return to the emergency room for evaluation. Please follow-up with your primary care provider or specialist as discussed.  °

## 2017-10-07 NOTE — ED Provider Notes (Signed)
[facility] Provider [facility]   CSN: [id number] Arrival date & time: 10/07/17  1055     History   Chief Complaint Chief Complaint  Patient presents with  . Migraine    HPI Aura CampsJennifer Wilcock is a 37 y.o. female.  HPI   37 year old female presents today with complaints of migraine.  Patient notes a significant past medical history of arterial venous malformation surgery in 2007.  Patient notes since that time she has not had significant headaches or migraines until last week; shortly after she reports that she gets cluster migraines and has been prescribed Imitrex for this which she took today did not improve her symptoms.  Patient denies any neck pain, fever, trauma, neurological deficits, or any other red flags.    Past Medical History:  Diagnosis Date  . Hypertension   . Seizures (HCC)     There are no active problems to display for this patient.   History reviewed. No pertinent surgical history.   OB History   None      Home Medications    Prior to Admission medications   Medication Sig Start Date End Date Taking? Authorizing Provider  albuterol (PROVENTIL HFA;VENTOLIN HFA) 108 (90 Base) MCG/ACT inhaler Inhale 2 puffs into the lungs every 6 (six) hours as needed for wheezing or shortness of breath.   Yes [provider]  amLODipine (NORVASC) 5 MG tablet Take 5 mg by mouth daily.   Yes [provider]  aspirin-acetaminophen-caffeine (EXCEDRIN MIGRAINE) [id number] MG tablet Take 2 tablets by mouth every 6 (six) hours as needed for headache or migraine.   Yes [provider]  budesonide-formoterol (SYMBICORT) 160-4.5 MCG/ACT inhaler Inhale 2 puffs into the lungs 2 (two) times daily.   Yes [provider]  butalbital-acetaminophen-caffeine (FIORICET, ESGIC) [phone] MG tablet Take 1-2 tablets by mouth every 6 (six) hours as needed for headache. 10/03/17 10/03/18 Yes Fayrene Helperran, [place], PA-C  gabapentin  (NEURONTIN) 800 MG tablet Take 800 mg by mouth 3 (three) times daily. 09/21/17  Yes [provider]  hydrOXYzine (ATARAX/VISTARIL) 50 MG tablet Take 50 mg by mouth 4 (four) times daily.    Yes [provider]  ibuprofen (ADVIL,MOTRIN) 200 MG tablet Take 800 mg by mouth every 6 (six) hours as needed for headache (pain).   Yes [provider]  lamoTRIgine (LAMICTAL) 100 MG tablet Take 100 mg by mouth 2 (two) times daily. 09/21/17  Yes [provider]  pantoprazole (PROTONIX) 20 MG tablet Take 20 mg by mouth daily. 09/21/17  Yes [provider]  rOPINIRole (REQUIP) 1 MG tablet Take 1 mg by mouth at bedtime. 09/21/17  Yes [provider]  SUMAtriptan (IMITREX) 100 MG tablet Take 100 mg by mouth as needed for migraine. 10/05/17  Yes [provider]  ARIPiprazole (ABILIFY) 30 MG tablet Take 30 mg by mouth daily. 08/24/17   [provider]    Family History No family history on file.  Social History Social History   Tobacco Use  . Smoking status: Current Every Day Smoker    Packs/day: 0.50  . Smokeless tobacco: Never Used  Substance Use Topics  . Alcohol use: Yes    Comment: occ  . Drug use: Yes    Types: Marijuana     Allergies   Penicillins   Review of Systems Review of Systems  All other systems reviewed and are negative.    Physical Exam Updated Vital Signs BP 125/77 (BP Location: Right Arm)  Pulse 90   Temp 97.8 F (36.6 C) (Oral)   Resp 16   LMP 10/01/2017   SpO2 100%   Physical Exam  Constitutional: She is oriented to person, place, and time. She appears well-developed and well-nourished.  HENT:  Head: Normocephalic and atraumatic.  Eyes: Pupils are equal, round, and reactive to light. Conjunctivae are normal. Right eye exhibits no discharge. Left eye exhibits no discharge. No scleral icterus.  Neck: Normal range of motion. Neck supple. No JVD present. No tracheal deviation present.  Pulmonary/Chest:  Effort normal. No stridor.  Neurological: She is alert and oriented to person, place, and time. She has normal strength. No cranial nerve deficit or sensory deficit. Coordination normal. GCS eye subscore is 4. GCS verbal subscore is 5. GCS motor subscore is 6.  Psychiatric: She has a normal mood and affect. Her behavior is normal. Judgment and thought content normal.  Nursing note and vitals reviewed.    ED Treatments / Results  Labs (all labs ordered are listed, but only abnormal results are displayed) Labs Reviewed - No data to display  EKG None  Radiology No results found.  Procedures Procedures (including critical care time)  Medications Ordered in ED Medications  butalbital-acetaminophen-caffeine (FIORICET, ESGIC) 50-325-40 MG per tablet 1 tablet (1 tablet Oral Given 10/07/17 1459)  ketorolac (TORADOL) 30 MG/ML injection 30 mg (30 mg Intramuscular Given 10/07/17 1500)  metoCLOPramide (REGLAN) tablet 5 mg (5 mg Oral Given 10/07/17 1459)  diphenhydrAMINE (BENADRYL) capsule 25 mg (25 mg Oral Given 10/07/17 1459)     Initial Impression / Assessment and Plan / ED Course  I have reviewed the triage vital signs and the nursing notes.  Pertinent labs & imaging results that were available during my care of the patient were reviewed by me and considered in my medical decision making (see chart for details).     Final Clinical Impressions(s) / ED Diagnoses   Final diagnoses:  Migraine without status migrainosus, not intractable, unspecified migraine type     Assessment/Plan: 37 year old female presents today with with migraine headache.  She has no red flags here today.  Symptoms improved with above medications.  Patient encouraged follow-up with neurology return precautions given.  Both patient and her sniffing other verbalized understanding and agreement to today's plan had no further questions or concerns      ED Discharge Orders    None       Rosalio Loud 10/07/17 1613    Benjiman Core, MD 10/07/17 2138

## 2017-10-07 NOTE — ED Notes (Signed)
Called pt for update on vitals, no response.

## 2017-10-07 NOTE — ED Triage Notes (Signed)
Pt presents for evaluation of headache x 9 days. Pt reports photophobia and mild nausea.

## 2017-10-07 NOTE — ED Notes (Signed)
Pt to desk stating she fell asleep. Updated patient. Will recheck her vitals.

## 2017-12-25 ENCOUNTER — Emergency Department (HOSPITAL_COMMUNITY)
Admission: EM | Admit: 2017-12-25 | Discharge: 2017-12-25 | Disposition: A | Discharge status: Home / Self Care | Payer: Medicaid Other | Attending: Emergency Medicine | Admitting: Emergency Medicine

## 2017-12-25 ENCOUNTER — Other Ambulatory Visit: Payer: Self-pay

## 2017-12-25 ENCOUNTER — Encounter (HOSPITAL_COMMUNITY): Payer: Self-pay | Admitting: *Deleted

## 2017-12-25 DIAGNOSIS — I10 Essential (primary) hypertension: Secondary | ICD-10-CM | POA: Diagnosis not present

## 2017-12-25 DIAGNOSIS — Z79899 Other long term (current) drug therapy: Secondary | ICD-10-CM | POA: Diagnosis not present

## 2017-12-25 DIAGNOSIS — N938 Other specified abnormal uterine and vaginal bleeding: Secondary | ICD-10-CM | POA: Diagnosis present

## 2017-12-25 DIAGNOSIS — F17200 Nicotine dependence, unspecified, uncomplicated: Secondary | ICD-10-CM | POA: Insufficient documentation

## 2017-12-25 DIAGNOSIS — N760 Acute vaginitis: Secondary | ICD-10-CM | POA: Insufficient documentation

## 2017-12-25 DIAGNOSIS — B9689 Other specified bacterial agents as the cause of diseases classified elsewhere: Secondary | ICD-10-CM | POA: Insufficient documentation

## 2017-12-25 LAB — COMPREHENSIVE METABOLIC PANEL
ALBUMIN: 4 g/dL (ref 3.5–5.0)
ALK PHOS: 85 U/L (ref 38–126)
ALT: 24 U/L (ref 0–44)
AST: 17 U/L (ref 15–41)
Anion gap: 10 (ref 5–15)
BILIRUBIN TOTAL: 0.6 mg/dL (ref 0.3–1.2)
BUN: 10 mg/dL (ref 6–20)
CALCIUM: 9.4 mg/dL (ref 8.9–10.3)
CO2: 21 mmol/L — ABNORMAL LOW (ref 22–32)
Chloride: 107 mmol/L (ref 98–111)
Creatinine, Ser: 0.82 mg/dL (ref 0.44–1.00)
GFR calc Af Amer: >60 mL/min (ref >60)
Glucose, Bld: 96 mg/dL (ref 70–99)
POTASSIUM: 4 mmol/L (ref 3.5–5.1)
Sodium: 138 mmol/L (ref 135–145)
Total Protein: 6.6 g/dL (ref 6.5–8.1)

## 2017-12-25 LAB — WET PREP, GENITAL
Sperm: NONE SEEN
TRICH WET PREP: NONE SEEN
YEAST WET PREP: NONE SEEN

## 2017-12-25 LAB — CBC
HCT: 43.7 % (ref 36.0–46.0)
HEMOGLOBIN: 14.3 g/dL (ref 12.0–15.0)
MCH: 29.1 pg (ref 26.0–34.0)
MCHC: 32.7 g/dL (ref 30.0–36.0)
MCV: 89 fL (ref 78.0–100.0)
Platelets: 395 10*3/uL (ref 150–400)
RBC: 4.91 MIL/uL (ref 3.87–5.11)
RDW: 13 % (ref 11.5–15.5)
WBC: 6.7 10*3/uL (ref 4.0–10.5)

## 2017-12-25 LAB — URINALYSIS, ROUTINE W REFLEX MICROSCOPIC
Bilirubin Urine: NEGATIVE
Glucose, UA: NEGATIVE mg/dL
Hgb urine dipstick: NEGATIVE
Ketones, ur: NEGATIVE mg/dL
Leukocytes, UA: NEGATIVE
Nitrite: NEGATIVE
Protein, ur: NEGATIVE mg/dL
SPECIFIC GRAVITY, URINE: 1.025 (ref 1.005–1.030)
pH: 6 (ref 5.0–8.0)

## 2017-12-25 LAB — LIPASE, BLOOD: Lipase: 29 U/L (ref 11–51)

## 2017-12-25 LAB — I-STAT BETA HCG BLOOD, ED (MC, WL, AP ONLY)

## 2017-12-25 MED ORDER — METRONIDAZOLE 500 MG PO TABS: 500.0000 mg | ORAL_TABLET | Freq: Two times a day (BID) | ORAL | 0 refills | Status: DC

## 2017-12-25 MED ORDER — KETOROLAC TROMETHAMINE 60 MG/2ML IM SOLN
30.0000 mg | Freq: Once | INTRAMUSCULAR | Status: AC
  Administered 2017-12-25: 30 mg via INTRAMUSCULAR
  Filled 2017-12-25: qty 2

## 2017-12-25 NOTE — ED Provider Notes (Signed)
[facility]   CSN: [id number] Arrival date & time: 12/25/17  [id number]     History   Chief Complaint Chief Complaint  Patient presents with  . Vaginal Bleeding    HPI Shannon Castillo is a 37 y.o. female.  HPI Shannon CampsJennifer Castillo is a 37 y.o. female with history of hypertension seizures, presents to emergency department complaining of vaginal bleeding.  Patient states that her bleeding started 3 days ago.  She states it was initially heavy but now slowed down.  She states this is is not a normal time for her menstrual cycle.  She states she had one just 2 weeks ago which was normal.  She states she had some lower abdominal cramping which now improved as well.  She denies any dizziness or lightheadedness.  She does report persistent nausea over the last week as well as diarrhea.  Reports decreased p.o. intake.  Reports that she has been very sleepy and has been sleeping for 12 to 15 hours a day.  States has no energy to do anything.  Past Medical History:  Diagnosis Date  . Hypertension   . Seizures (HCC)     There are no active problems to display for this patient.   History reviewed. No pertinent surgical history.   OB History   None      Home Medications    Prior to Admission medications   Medication Sig Start Date End Date Taking? Authorizing Provider  albuterol (PROVENTIL HFA;VENTOLIN HFA) 108 (90 Base) MCG/ACT inhaler Inhale 2 puffs into the lungs every 6 (six) hours as needed for wheezing or shortness of breath.    [provider]  amLODipine (NORVASC) 5 MG tablet Take 5 mg by mouth daily.    [provider]  ARIPiprazole (ABILIFY) 30 MG tablet Take 30 mg by mouth daily. 08/24/17   [provider]  aspirin-acetaminophen-caffeine (EXCEDRIN MIGRAINE) [phone] MG tablet Take 2 tablets by mouth every 6 (six) hours as needed for headache or migraine.    [provider]  budesonide-formoterol  (SYMBICORT) 160-4.5 MCG/ACT inhaler Inhale 2 puffs into the lungs 2 (two) times daily.    [provider]  butalbital-acetaminophen-caffeine (FIORICET, ESGIC) [phone] MG tablet Take 1-2 tablets by mouth every 6 (six) hours as needed for headache. 10/03/17 10/03/18  Fayrene Helperran, [place], PA-C  gabapentin (NEURONTIN) 800 MG tablet Take 800 mg by mouth 3 (three) times daily. 09/21/17   [provider]  hydrOXYzine (ATARAX/VISTARIL) 50 MG tablet Take 50 mg by mouth 4 (four) times daily.     [provider]  ibuprofen (ADVIL,MOTRIN) 200 MG tablet Take 800 mg by mouth every 6 (six) hours as needed for headache (pain).    [provider]  lamoTRIgine (LAMICTAL) 100 MG tablet Take 100 mg by mouth 2 (two) times daily. 09/21/17   [provider]  pantoprazole (PROTONIX) 20 MG tablet Take 20 mg by mouth daily. 09/21/17   [provider]  rOPINIRole (REQUIP) 1 MG tablet Take 1 mg by mouth at bedtime. 09/21/17   [provider]  SUMAtriptan (IMITREX) 100 MG tablet Take 100 mg by mouth as needed for migraine. 10/05/17   [provider]    Family History History reviewed. No pertinent family history.  Social History Social History   Tobacco Use  . Smoking status: Current Every Day Smoker    Packs/day: 0.50  . Smokeless tobacco: Never Used  Substance Use Topics  . Alcohol use: Yes  Comment: occ  . Drug use: Yes    Types: Marijuana     Allergies   Penicillins   Review of Systems Review of Systems   Physical Exam Updated Vital Signs BP (!) 142/102 (BP Location: Right Arm)   Pulse 84   Temp 97.8 F (36.6 C) (Oral)   Resp 17   SpO2 97%   Physical Exam  Constitutional: She appears well-developed and well-nourished. No distress.  HENT:  Head: Normocephalic.  Eyes: Conjunctivae are normal.  Neck: Neck supple.  Cardiovascular: Normal rate, regular rhythm and normal heart sounds.  Pulmonary/Chest: Effort normal and breath sounds  normal. No respiratory distress. She has no wheezes. She has no rales.  Abdominal: Soft. Bowel sounds are normal. She exhibits no distension. There is no tenderness. There is no rebound.  Genitourinary:  Genitourinary Comments: Normal external genitalia. Normal vaginal canal. Small thin pink discharge. Cervix is normal, closed. No CMT. No uterine or adnexal tenderness. No masses palpated.    Musculoskeletal: She exhibits no edema.  Neurological: She is alert.  Skin: Skin is warm and dry.  Psychiatric: She has a normal mood and affect. Her behavior is normal.  Nursing note and vitals reviewed.    ED Treatments / Results  Labs (all labs ordered are listed, but only abnormal results are displayed) Labs Reviewed  COMPREHENSIVE METABOLIC PANEL - Abnormal; Notable for the following components:      Result Value   CO2 21 (*)    All other components within normal limits  WET PREP, GENITAL  LIPASE, BLOOD  CBC  URINALYSIS, ROUTINE W REFLEX MICROSCOPIC  I-STAT BETA HCG BLOOD, ED ([facility], [facility], [facility] ONLY)  GC/CHLAMYDIA PROBE AMP ([place]) NOT AT [facility]    EKG None  Radiology No results found.  Procedures Procedures (including critical care time)  Medications Ordered in ED Medications  ketorolac (TORADOL) injection 30 mg (has no administration in time range)     Initial Impression / Assessment and Plan / ED Course  I have reviewed the triage vital signs and the nursing notes.  Pertinent labs & imaging results that were available during my care of the patient were reviewed by me and considered in my medical decision making (see chart for details).     Patient in the emergency department with abnormal vaginal bleeding.  Labs obtained in triage show normal CBC, normal CMP and lipase, negative pregnancy test.  Urinalysis unremarkable.  Pelvic exam performed with no significant findings other than pink discharge.  No bleeding other than pinkish discoloration to her discharge.  Cervix is  normal.  No cervical motion tenderness, no uterine or adnexal tenderness.  Blood pressures clue cells and many WBCs.  Given unremarkable exam, do not think patient has STI, however gonorrhea and Chlamydia culture sent.  She states she is in a monogamous relationship.  We will treat her for back to vaginosis and have her follow-up closely with her primary care doctor for dysfunctional uterine bleeding.  Return precautions discussed.  Vitals:   12/25/17 1145 12/25/17 1200 12/25/17 1215 12/25/17 1315  BP: (!) 145/96 124/81 (!) 130/92   Pulse:  76 68 67  Resp:      Temp:      TempSrc:      SpO2:  99% 100% 100%     Final Clinical Impressions(s) / ED Diagnoses   Final diagnoses:  Dysfunctional uterine bleeding  Bacterial vaginosis    ED Discharge Orders        Ordered  metroNIDAZOLE (FLAGYL) 500 MG tablet  2 times daily     12/25/17 1421       Jaynie Crumble, PA-C 12/25/17 1422    Melene Plan, DO 12/25/17 1954

## 2017-12-25 NOTE — ED Triage Notes (Signed)
Pt in c/o abnormal vaginal bleeding for the last three days, reports it has been very heavy, also diarrhea for the last week, reports mild abdominal pain

## 2017-12-25 NOTE — ED Notes (Signed)
Patient given a soda

## 2017-12-25 NOTE — Discharge Instructions (Addendum)
Take ibuprofen or Tylenol for your cramping.  Take Flagyl as prescribed until all gone for your bacterial vaginosis.  Follow-up with family doctor.  Return if worsening

## 2017-12-28 LAB — GC/CHLAMYDIA PROBE AMP (~~LOC~~) NOT AT ARMC
Chlamydia: NEGATIVE
Neisseria Gonorrhea: NEGATIVE

## 2018-03-26 ENCOUNTER — Ambulatory Visit (HOSPITAL_COMMUNITY)
Admission: EM | Admit: 2018-03-26 | Discharge: 2018-03-26 | Disposition: A | Discharge status: Home / Self Care | Payer: Medicaid Other | Attending: Family Medicine | Admitting: Family Medicine

## 2018-03-26 ENCOUNTER — Encounter (HOSPITAL_COMMUNITY): Payer: Self-pay | Admitting: Emergency Medicine

## 2018-03-26 DIAGNOSIS — Z7951 Long term (current) use of inhaled steroids: Secondary | ICD-10-CM | POA: Diagnosis not present

## 2018-03-26 DIAGNOSIS — J111 Influenza due to unidentified influenza virus with other respiratory manifestations: Secondary | ICD-10-CM | POA: Diagnosis present

## 2018-03-26 DIAGNOSIS — Z880 Allergy status to penicillin: Secondary | ICD-10-CM | POA: Diagnosis not present

## 2018-03-26 DIAGNOSIS — B9689 Other specified bacterial agents as the cause of diseases classified elsewhere: Secondary | ICD-10-CM | POA: Diagnosis not present

## 2018-03-26 DIAGNOSIS — K5900 Constipation, unspecified: Secondary | ICD-10-CM | POA: Insufficient documentation

## 2018-03-26 DIAGNOSIS — F17210 Nicotine dependence, cigarettes, uncomplicated: Secondary | ICD-10-CM | POA: Diagnosis not present

## 2018-03-26 DIAGNOSIS — Z7982 Long term (current) use of aspirin: Secondary | ICD-10-CM | POA: Insufficient documentation

## 2018-03-26 DIAGNOSIS — J069 Acute upper respiratory infection, unspecified: Secondary | ICD-10-CM | POA: Insufficient documentation

## 2018-03-26 DIAGNOSIS — Z791 Long term (current) use of non-steroidal anti-inflammatories (NSAID): Secondary | ICD-10-CM | POA: Diagnosis not present

## 2018-03-26 DIAGNOSIS — Z8673 Personal history of transient ischemic attack (TIA), and cerebral infarction without residual deficits: Secondary | ICD-10-CM | POA: Insufficient documentation

## 2018-03-26 DIAGNOSIS — I10 Essential (primary) hypertension: Secondary | ICD-10-CM | POA: Insufficient documentation

## 2018-03-26 DIAGNOSIS — R300 Dysuria: Secondary | ICD-10-CM | POA: Diagnosis present

## 2018-03-26 DIAGNOSIS — R12 Heartburn: Secondary | ICD-10-CM | POA: Diagnosis not present

## 2018-03-26 DIAGNOSIS — N390 Urinary tract infection, site not specified: Secondary | ICD-10-CM | POA: Diagnosis not present

## 2018-03-26 DIAGNOSIS — Z79899 Other long term (current) drug therapy: Secondary | ICD-10-CM | POA: Diagnosis not present

## 2018-03-26 DIAGNOSIS — R569 Unspecified convulsions: Secondary | ICD-10-CM | POA: Diagnosis not present

## 2018-03-26 DIAGNOSIS — R112 Nausea with vomiting, unspecified: Secondary | ICD-10-CM

## 2018-03-26 DIAGNOSIS — R6889 Other general symptoms and signs: Secondary | ICD-10-CM

## 2018-03-26 HISTORY — DX: Cerebral infarction, unspecified: I63.9

## 2018-03-26 LAB — POCT URINALYSIS DIP (DEVICE)
BILIRUBIN URINE: NEGATIVE
GLUCOSE, UA: NEGATIVE mg/dL
HGB URINE DIPSTICK: NEGATIVE
Ketones, ur: NEGATIVE mg/dL
NITRITE: NEGATIVE
Protein, ur: NEGATIVE mg/dL
Specific Gravity, Urine: >=1.03 (ref 1.005–1.030)
UROBILINOGEN UA: 0.2 mg/dL (ref 0.0–1.0)
pH: 6 (ref 5.0–8.0)

## 2018-03-26 LAB — POCT PREGNANCY, URINE: Preg Test, Ur: NEGATIVE

## 2018-03-26 MED ORDER — NITROFURANTOIN MONOHYD MACRO 100 MG PO CAPS: 100.0000 mg | ORAL_CAPSULE | Freq: Two times a day (BID) | ORAL | 0 refills | Status: DC

## 2018-03-26 MED ORDER — GI COCKTAIL ~~LOC~~
ORAL | Status: AC
  Filled 2018-03-26: qty 30

## 2018-03-26 MED ORDER — GI COCKTAIL ~~LOC~~
30.0000 mL | Freq: Once | ORAL | Status: AC
  Administered 2018-03-26: 30 mL via ORAL

## 2018-03-26 MED ORDER — ONDANSETRON HCL 4 MG PO TABS: 4.0000 mg | ORAL_TABLET | Freq: Four times a day (QID) | ORAL | 0 refills | Status: DC

## 2018-03-26 NOTE — Discharge Instructions (Signed)
GI cocktail given in office Get rest and drink fluids Zofran prescribed.  Take as directed.    DIET Instructions: 30 minutes after taking nausea medicine, begin with sips of clear liquids. If able to hold down 2 - 4 ounces for 30 minutes, begin drinking more. Increase your fluid intake to replace losses. Clear liquids only for 24 hours (water, tea, sport drinks, clear flat ginger ale or cola and juices, broth, jello, popsicles, ect). Advance to bland foods, applesauce, rice, baked or boiled chicken, ect. Avoid milk, greasy foods and anything that doesnt agree with you. If you experience new or worsening symptoms return or go to ER If vomiting persists and you are unable to hold fluids, or if you develop high fever or abdominal pain, return here, see your doctor or go to the ER.   Urine pregnancy was negative Urine showed signs of potential infection.  We will send your urine out for culture. Prescribed an antibiotic.  Take as directed and to completion Follow up with PCP or [facility] if symptoms Return or go to the ED if you have any new or worsening symptoms such as worsening pain, continued burning with urination, increased

## 2018-03-26 NOTE — ED Provider Notes (Signed)
[facility]   [id number] 03/26/18 Arrival Time: 1449  CC: flu-like symptoms and dysuria  SUBJECTIVE:  Luberta Grabinski is a 37 y.o. female hx significant for seizures, stroke, and HTN, who presents with complaint of heartburn, nausea and vomiting x 4-5 episodes x 2 days.  Denies a precipitating event, or trauma. Husband with similar symptoms.  Reports associated abdominal soreness, like she has done an ab work-out, but denies pain.  Has not tried OTC medications.  Worse with eating.  Denies similar symptoms in the past.  Last BM 3-4 days ago.  Complains of constipation.  Patient also complains of associated dry cough, subjective fever, chills, and body aches x 2 days.    Patient also mentions dysuria, urinary frequency and urgency x 2 weeks. Denies precipitating event, does admit to drinking excessive caffeine.  Denies aggravating or alleviating factors.  Reports previous symptoms.    Denies appetite changes, weight changes, chest pain, SOB, diarrhea, hematochezia, melena, or flank pain.  No LMP recorded.  ROS: As per HPI.  Past Medical History:  Diagnosis Date  . Hypertension   . Seizures (HCC)   . Stroke [facility])    x2   Past Surgical History:  Procedure Laterality Date  . BRAIN SURGERY     Allergies  Allergen Reactions  . Penicillins Anaphylaxis    Has patient had a PCN reaction causing immediate rash, facial/tongue/throat swelling, SOB or lightheadedness with hypotension: Yes Has patient had a PCN reaction causing severe rash involving mucus membranes or skin necrosis: No Has patient had a PCN reaction that required hospitalization: No Has patient had a PCN reaction occurring within the last 10 years: No If all of the above answers are "NO", then may proceed with Cephalosporin use.   No current facility-administered medications on file prior to encounter.    Current Outpatient Medications on File Prior to Encounter  Medication Sig Dispense Refill  . albuterol  (PROVENTIL HFA;VENTOLIN HFA) 108 (90 Base) MCG/ACT inhaler Inhale 2 puffs into the lungs every 6 (six) hours as needed for wheezing or shortness of breath.    Marland Kitchen amLODipine (NORVASC) 5 MG tablet Take 5 mg by mouth daily.    . ARIPiprazole (ABILIFY) 30 MG tablet Take 30 mg by mouth daily.  0  . aspirin-acetaminophen-caffeine (EXCEDRIN MIGRAINE) 250-250-65 MG tablet Take 2 tablets by mouth every 6 (six) hours as needed for headache or migraine.    . budesonide-formoterol (SYMBICORT) 160-4.5 MCG/ACT inhaler Inhale 2 puffs into the lungs 2 (two) times daily.    . butalbital-acetaminophen-caffeine (FIORICET, ESGIC) 50-325-40 MG tablet Take 1-2 tablets by mouth every 6 (six) hours as needed for headache. 20 tablet 0  . gabapentin (NEURONTIN) 800 MG tablet Take 800 mg by mouth 3 (three) times daily.  2  . hydrOXYzine (ATARAX/VISTARIL) 50 MG tablet Take 50 mg by mouth 4 (four) times daily.     Marland Kitchen ibuprofen (ADVIL,MOTRIN) 200 MG tablet Take 800 mg by mouth every 6 (six) hours as needed for headache (pain).    Marland Kitchen lamoTRIgine (LAMICTAL) 100 MG tablet Take 100 mg by mouth 2 (two) times daily.  2  . metroNIDAZOLE (FLAGYL) 500 MG tablet Take 1 tablet (500 mg total) by mouth 2 (two) times daily. 14 tablet 0  . pantoprazole (PROTONIX) 20 MG tablet Take 20 mg by mouth daily.  2  . rOPINIRole (REQUIP) 1 MG tablet Take 1 mg by mouth at bedtime.  2  . SUMAtriptan (IMITREX) 100 MG tablet Take 100 mg by mouth as  needed for migraine.  1   Social History   Socioeconomic History  . Marital status: Married    Spouse name: Not on file  . Number of children: Not on file  . Years of education: Not on file  . Highest education level: Not on file  Occupational History  . Not on file  Social Needs  . Financial resource strain: Not on file  . Food insecurity:    Worry: Not on file    Inability: Not on file  . Transportation needs:    Medical: Not on file    Non-medical: Not on file  Tobacco Use  . Smoking status:  Current Every Day Smoker    Packs/day: 0.50  . Smokeless tobacco: Never Used  Substance and Sexual Activity  . Alcohol use: Yes    Comment: occ  . Drug use: Yes    Types: Marijuana  . Sexual activity: Not on file  Lifestyle  . Physical activity:    Days per week: Not on file    Minutes per session: Not on file  . Stress: Not on file  Relationships  . Social connections:    Talks on phone: Not on file    Gets together: Not on file    Attends religious service: Not on file    Active member of club or organization: Not on file    Attends meetings of clubs or organizations: Not on file    Relationship status: Not on file  . Intimate partner violence:    Fear of current or ex partner: Not on file    Emotionally abused: Not on file    Physically abused: Not on file    Forced sexual activity: Not on file  Other Topics Concern  . Not on file  Social History Narrative  . Not on file   Family History  Problem Relation Age of Onset  . Cancer Mother      OBJECTIVE:  Vitals:   03/26/18 1533  BP: (!) 127/91  Pulse: 97  Resp: 16  Temp: 98.3 F (36.8 C)  TempSrc: Oral  SpO2: 100%    General appearance: AOx3 in no acute distress; appears fatigued, but nontoxic  HEENT: NCAT. Ears: EACs + for cerumen, TMs pearly gray; Eyes: PERRL.  EOM grossly intact.  Sinuses nontender; Nose: no obvious rhinorrhea; tonsils nonerythematous, uvula midline Lungs: clear to auscultation bilaterally without adventitious breath sounds Heart: regular rate and rhythm.  Radial pulses 2+ symmetrical bilaterally Abdomen: soft, non-distended; normal active bowel sounds; mild suprapubic tenderness; nontender at McBurney's point;  no guarding Back: no CVA tenderness Extremities: no edema; symmetrical with no gross deformities Skin: warm and dry Neurologic: normal gait Psychological: alert and cooperative; normal mood and affect  Labs: Results for orders placed or performed during the hospital encounter  of 03/26/18 (from the past 24 hour(s))  POCT urinalysis dip (device)     Status: Abnormal   Collection Time: 03/26/18  4:04 PM  Result Value Ref Range   Glucose, UA NEGATIVE NEGATIVE mg/dL   Bilirubin Urine NEGATIVE NEGATIVE   Ketones, ur NEGATIVE NEGATIVE mg/dL   Specific Gravity, Urine >=1.030 1.005 - 1.030   Hgb urine dipstick NEGATIVE NEGATIVE   pH 6.0 5.0 - 8.0   Protein, ur NEGATIVE NEGATIVE mg/dL   Urobilinogen, UA 0.2 0.0 - 1.0 mg/dL   Nitrite NEGATIVE NEGATIVE   Leukocytes, UA TRACE (A) NEGATIVE  Pregnancy, urine POC     Status: None   Collection Time: 03/26/18  4:10  PM  Result Value Ref Range   Preg Test, Ur NEGATIVE NEGATIVE    EKG    EKG normal sinus rhythm without ST elevations, depressions, or prolonged PR interval.  No narrowing or widening of the QRS complexes.  Reviewed EKG with Dr. Milus Glazier.    ASSESSMENT & PLAN:  1. Heart burn   2. Non-intractable vomiting with nausea, unspecified vomiting type   3. Urinary tract infection without hematuria, site unspecified   4. Viral URI with cough     Meds ordered this encounter  Medications  . gi cocktail (Maalox,Lidocaine,Donnatal)  . ondansetron (ZOFRAN) 4 MG tablet    Sig: Take 1 tablet (4 mg total) by mouth every 6 (six) hours.    Dispense:  12 tablet    Refill:  0    Order Specific Question:   Supervising Provider    Answer:   Isa Rankin [phone]  . nitrofurantoin, macrocrystal-monohydrate, (MACROBID) 100 MG capsule    Sig: Take 1 capsule (100 mg total) by mouth 2 (two) times daily.    Dispense:  10 capsule    Refill:  0    Order Specific Question:   Supervising Provider    Answer:   Isa Rankin [[id number]]   Get rest and drink fluids Continue with OTC medications as needed for symptomatic relief  GI cocktail given in office.  Patient reports improvement in symptoms following cocktail   Zofran prescribed for nausea.  Take as directed.    DIET Instructions: 30 minutes after taking  nausea medicine, begin with sips of clear liquids. If able to hold down 2 - 4 ounces for 30 minutes, begin drinking more. Increase your fluid intake to replace losses. Clear liquids only for 24 hours (water, tea, sport drinks, clear flat ginger ale or cola and juices, broth, jello, popsicles, ect). Advance to bland foods, applesauce, rice, baked or boiled chicken, ect. Avoid milk, greasy foods and anything that doesn't agree with you. If you experience new or worsening symptoms return or go to ER If vomiting persists and you are unable to hold fluids, or if you develop high fever or abdominal pain, return here, see your doctor or go to the ER.   Urine pregnancy was negative Urine showed signs of potential infection.  We will send your urine out for culture. Prescribed an antibiotic.  Take as directed and to completion Follow up with PCP or [facility] if symptoms Return or go to the ED if you have any new or worsening symptoms such as worsening pain, continued burning with urination, increased  Reviewed expectations re: course of current medical issues. Questions answered. Outlined signs and symptoms indicating need for more acute intervention. Patient verbalized understanding. After Visit Summary given.   Rennis Harding, PA-C 03/26/18 2111

## 2018-03-26 NOTE — ED Triage Notes (Signed)
Pt here for N/V and cough

## 2018-03-27 LAB — URINE CULTURE: Culture: NO GROWTH

## 2018-04-19 ENCOUNTER — Ambulatory Visit (HOSPITAL_COMMUNITY)
Admission: EM | Admit: 2018-04-19 | Discharge: 2018-04-19 | Disposition: A | Discharge status: Home / Self Care | Payer: Medicaid Other | Attending: Emergency Medicine | Admitting: Emergency Medicine

## 2018-04-19 ENCOUNTER — Other Ambulatory Visit: Payer: Self-pay

## 2018-04-19 ENCOUNTER — Encounter (HOSPITAL_COMMUNITY): Payer: Self-pay | Admitting: *Deleted

## 2018-04-19 DIAGNOSIS — B850 Pediculosis due to Pediculus humanus capitis: Secondary | ICD-10-CM

## 2018-04-19 DIAGNOSIS — J40 Bronchitis, not specified as acute or chronic: Secondary | ICD-10-CM | POA: Diagnosis not present

## 2018-04-19 DIAGNOSIS — R062 Wheezing: Secondary | ICD-10-CM

## 2018-04-19 DIAGNOSIS — R05 Cough: Secondary | ICD-10-CM | POA: Diagnosis not present

## 2018-04-19 DIAGNOSIS — F17210 Nicotine dependence, cigarettes, uncomplicated: Secondary | ICD-10-CM

## 2018-04-19 HISTORY — DX: Unspecified asthma, uncomplicated: J45.909

## 2018-04-19 MED ORDER — PERMETHRIN 1 % EX LIQD: Freq: Once | CUTANEOUS | 0 refills | Status: AC

## 2018-04-19 MED ORDER — IPRATROPIUM-ALBUTEROL 0.5-2.5 (3) MG/3ML IN SOLN
3.0000 mL | Freq: Once | RESPIRATORY_TRACT | Status: AC
  Administered 2018-04-19: 3 mL via RESPIRATORY_TRACT

## 2018-04-19 MED ORDER — IPRATROPIUM-ALBUTEROL 0.5-2.5 (3) MG/3ML IN SOLN
RESPIRATORY_TRACT | Status: AC
  Filled 2018-04-19: qty 3

## 2018-04-19 MED ORDER — AZITHROMYCIN 250 MG PO TABS: ORAL_TABLET | ORAL | 0 refills | Status: AC

## 2018-04-19 MED ORDER — ALBUTEROL SULFATE HFA 108 (90 BASE) MCG/ACT IN AERS: 2.0000 | INHALATION_SPRAY | Freq: Four times a day (QID) | RESPIRATORY_TRACT | 0 refills | Status: DC | PRN

## 2018-04-19 MED ORDER — PREDNISONE 20 MG PO TABS: 40.0000 mg | ORAL_TABLET | Freq: Every day | ORAL | 0 refills | Status: AC

## 2018-04-19 MED ORDER — GUAIFENESIN ER 600 MG PO TB12: 1200.0000 mg | ORAL_TABLET | Freq: Two times a day (BID) | ORAL | 0 refills | Status: AC | PRN

## 2018-04-19 NOTE — Discharge Instructions (Signed)
Push fluids to ensure adequate hydration and keep secretions thin.  Tylenol and/or ibuprofen as needed for pain or fevers.  Use of inhaler every 4-6 hours as needed for wheezing or shortness of breath.  Complete course of antibiotics.  Mucinex to loosen secretions. 5 days of prednisone.  If symptoms worsen or do not improve in the next week to return to be seen or to follow up with PCP.

## 2018-04-19 NOTE — ED Triage Notes (Signed)
C/o chest and nasal congestion onset 1 week ago, states it is worse at night and she can;t breath.

## 2018-04-19 NOTE — ED Provider Notes (Signed)
[facility]    CSN: [id number] Arrival date & time: 04/19/18  1317     History   Chief Complaint Chief Complaint  Patient presents with  . Shortness of Breath  . Nasal Congestion  . Cough    HPI Shannon Castillo is a 37 y.o. female.   Shannon Castillo presents with complaints of shortness of breath , congestion, cough, runny nose, ear pain. Started approximately 1 week ago. Worse last night. Hasn't taken any regular medicines for symptoms. Used inhaler once which didn't help. No known ill contacts. History of asthma. Smokes 0.5ppd. No abdominal pani. No rash. States she is concerned about lice and requests prescription for treatment as hasn't been able to get this herself. Hx of seizures, stroke, htn.     ROS per HPI.      Past Medical History:  Diagnosis Date  . Asthma   . Hypertension   . Seizures (HCC)   . Stroke [facility])    x2    There are no active problems to display for this patient.   Past Surgical History:  Procedure Laterality Date  . BRAIN SURGERY      OB History   None      Home Medications    Prior to Admission medications   Medication Sig Start Date End Date Taking? Authorizing Provider  albuterol (PROVENTIL HFA;VENTOLIN HFA) 108 (90 Base) MCG/ACT inhaler Inhale 2 puffs into the lungs every 6 (six) hours as needed for wheezing or shortness of breath. 04/19/18   Linus Mako B, NP  amLODipine (NORVASC) 5 MG tablet Take 5 mg by mouth daily.    [provider]  ARIPiprazole (ABILIFY) 30 MG tablet Take 30 mg by mouth daily. 08/24/17   [provider]  aspirin-acetaminophen-caffeine (EXCEDRIN MIGRAINE) [phone] MG tablet Take 2 tablets by mouth every 6 (six) hours as needed for headache or migraine.    [provider]  azithromycin (ZITHROMAX) 250 MG tablet Take 2 tablets (500 mg total) by mouth daily for 1 day, THEN 1 tablet (250 mg total) daily for 4 days. 04/19/18 04/24/18  Georgetta Haber, NP  budesonide-formoterol  (SYMBICORT) 160-4.5 MCG/ACT inhaler Inhale 2 puffs into the lungs 2 (two) times daily.    [provider]  butalbital-acetaminophen-caffeine (FIORICET, ESGIC) [phone] MG tablet Take 1-2 tablets by mouth every 6 (six) hours as needed for headache. 10/03/17 10/03/18  Fayrene Helper, PA-C  gabapentin (NEURONTIN) 800 MG tablet Take 800 mg by mouth 3 (three) times daily. 09/21/17   [provider]  guaiFENesin (MUCINEX) 600 MG 12 hr tablet Take 2 tablets (1,200 mg total) by mouth 2 (two) times daily as needed for up to 5 days. 04/19/18 04/24/18  Georgetta Haber, NP  hydrOXYzine (ATARAX/VISTARIL) 50 MG tablet Take 50 mg by mouth 4 (four) times daily.     [provider]  ibuprofen (ADVIL,MOTRIN) 200 MG tablet Take 800 mg by mouth every 6 (six) hours as needed for headache (pain).    [provider]  lamoTRIgine (LAMICTAL) 100 MG tablet Take 100 mg by mouth 2 (two) times daily. 09/21/17   [provider]  pantoprazole (PROTONIX) 20 MG tablet Take 20 mg by mouth daily. 09/21/17   [provider]  permethrin (NIX CREME RINSE) 1 % liquid Apply topically once for 1 dose. 04/19/18 04/19/18  Georgetta Haber, NP  predniSONE (DELTASONE) 20 MG tablet Take 2 tablets (40 mg total) by mouth daily with breakfast for 5 days. 04/19/18 04/24/18  Linus Mako  B, NP  rOPINIRole (REQUIP) 1 MG tablet Take 1 mg by mouth at bedtime. 09/21/17   [provider]  SUMAtriptan (IMITREX) 100 MG tablet Take 100 mg by mouth as needed for migraine. 10/05/17   [provider]    Family History Family History  Problem Relation Age of Onset  . Cancer Mother     Social History Social History   Tobacco Use  . Smoking status: Current Every Day Smoker    Packs/day: 0.50  . Smokeless tobacco: Never Used  Substance Use Topics  . Alcohol use: Not Currently    Comment: occ  . Drug use: Yes    Types: Marijuana     Allergies   Penicillins   Review of Systems Review of  Systems   Physical Exam Triage Vital Signs ED Triage Vitals  Enc Vitals Group     BP 04/19/18 1444 111/76     Pulse Rate 04/19/18 1444 95     Resp 04/19/18 1444 18     Temp 04/19/18 1444 98.4 F (36.9 C)     Temp Source 04/19/18 1444 Oral     SpO2 04/19/18 1444 98 %     Weight --      Height --      Head Circumference --      Peak Flow --      Pain Score 04/19/18 1446 1     Pain Loc --      Pain Edu? --      Excl. in GC? --    No data found.  Updated Vital Signs BP 111/76 (BP Location: Right Arm)   Pulse 95   Temp 98.4 F (36.9 C) (Oral)   Resp 18   LMP 04/12/2018 (Approximate)   SpO2 98%    Physical Exam  Constitutional: She is oriented to person, place, and time. She appears well-developed and well-nourished. No distress.  HENT:  Head: Normocephalic and atraumatic.  Right Ear: Tympanic membrane, external ear and ear canal normal.  Left Ear: Tympanic membrane, external ear and ear canal normal.  Nose: Nose normal.  Mouth/Throat: Uvula is midline, oropharynx is clear and moist and mucous membranes are normal. No tonsillar exudate.  Eyes: Pupils are equal, round, and reactive to light. Conjunctivae and EOM are normal.  Cardiovascular: Normal rate, regular rhythm and normal heart sounds.  Pulmonary/Chest: Effort normal. No respiratory distress. She has wheezes.  Strong moist congested cough noted   Abdominal: Soft.  Neurological: She is alert and oriented to person, place, and time.  Skin: Skin is warm and dry.     UC Treatments / Results  Labs (all labs ordered are listed, but only abnormal results are displayed) Labs Reviewed - No data to display  EKG None  Radiology No results found.  Procedures Procedures (including critical care time)  Medications Ordered in UC Medications  ipratropium-albuterol (DUONEB) 0.5-2.5 (3) MG/3ML nebulizer solution 3 mL (has no administration in time range)    Initial Impression / Assessment and Plan / UC Course  I  have reviewed the triage vital signs and the nursing notes.  Pertinent labs & imaging results that were available during my care of the patient were reviewed by me and considered in my medical decision making (see chart for details).     A week of symptoms with worsening of cough and wheezing in this patient with asthma and smokes. Neb provided in clinic today. Inhaler refilled, prednisone and azithromycin provided today. Nix treatment prescribed. Return precautions provided.  If symptoms worsen or do not improve in the next week to return to be seen or to follow up with PCP.  Patient verbalized understanding and agreeable to plan.   Final Clinical Impressions(s) / UC Diagnoses   Final diagnoses:  Bronchitis  Head lice     Discharge Instructions     Push fluids to ensure adequate hydration and keep secretions thin.  Tylenol and/or ibuprofen as needed for pain or fevers.  Use of inhaler every 4-6 hours as needed for wheezing or shortness of breath.  Complete course of antibiotics.  Mucinex to loosen secretions. 5 days of prednisone.  If symptoms worsen or do not improve in the next week to return to be seen or to follow up with PCP.      ED Prescriptions    Medication Sig Dispense Auth. Provider   albuterol (PROVENTIL HFA;VENTOLIN HFA) 108 (90 Base) MCG/ACT inhaler Inhale 2 puffs into the lungs every 6 (six) hours as needed for wheezing or shortness of breath. 1 Inhaler Linus Mako B, NP   predniSONE (DELTASONE) 20 MG tablet Take 2 tablets (40 mg total) by mouth daily with breakfast for 5 days. 10 tablet Linus Mako B, NP   azithromycin (ZITHROMAX) 250 MG tablet Take 2 tablets (500 mg total) by mouth daily for 1 day, THEN 1 tablet (250 mg total) daily for 4 days. 6 tablet Linus Mako B, NP   guaiFENesin (MUCINEX) 600 MG 12 hr tablet Take 2 tablets (1,200 mg total) by mouth 2 (two) times daily as needed for up to 5 days. 20 tablet Linus Mako B, NP   permethrin (NIX CREME  RINSE) 1 % liquid Apply topically once for 1 dose. 118 mL Linus Mako B, NP     Controlled Substance Prescriptions [place] Controlled Substance Registry consulted? Not Applicable   Georgetta Haber, NP 04/19/18 1526

## 2018-12-08 IMAGING — DX DG CHEST 2V
2 series · 2 of 2 positions shown · non-contrast
Comparison: None.

CLINICAL DATA: Cough, fever

EXAM:
CHEST  2 VIEW

[w chest pa]
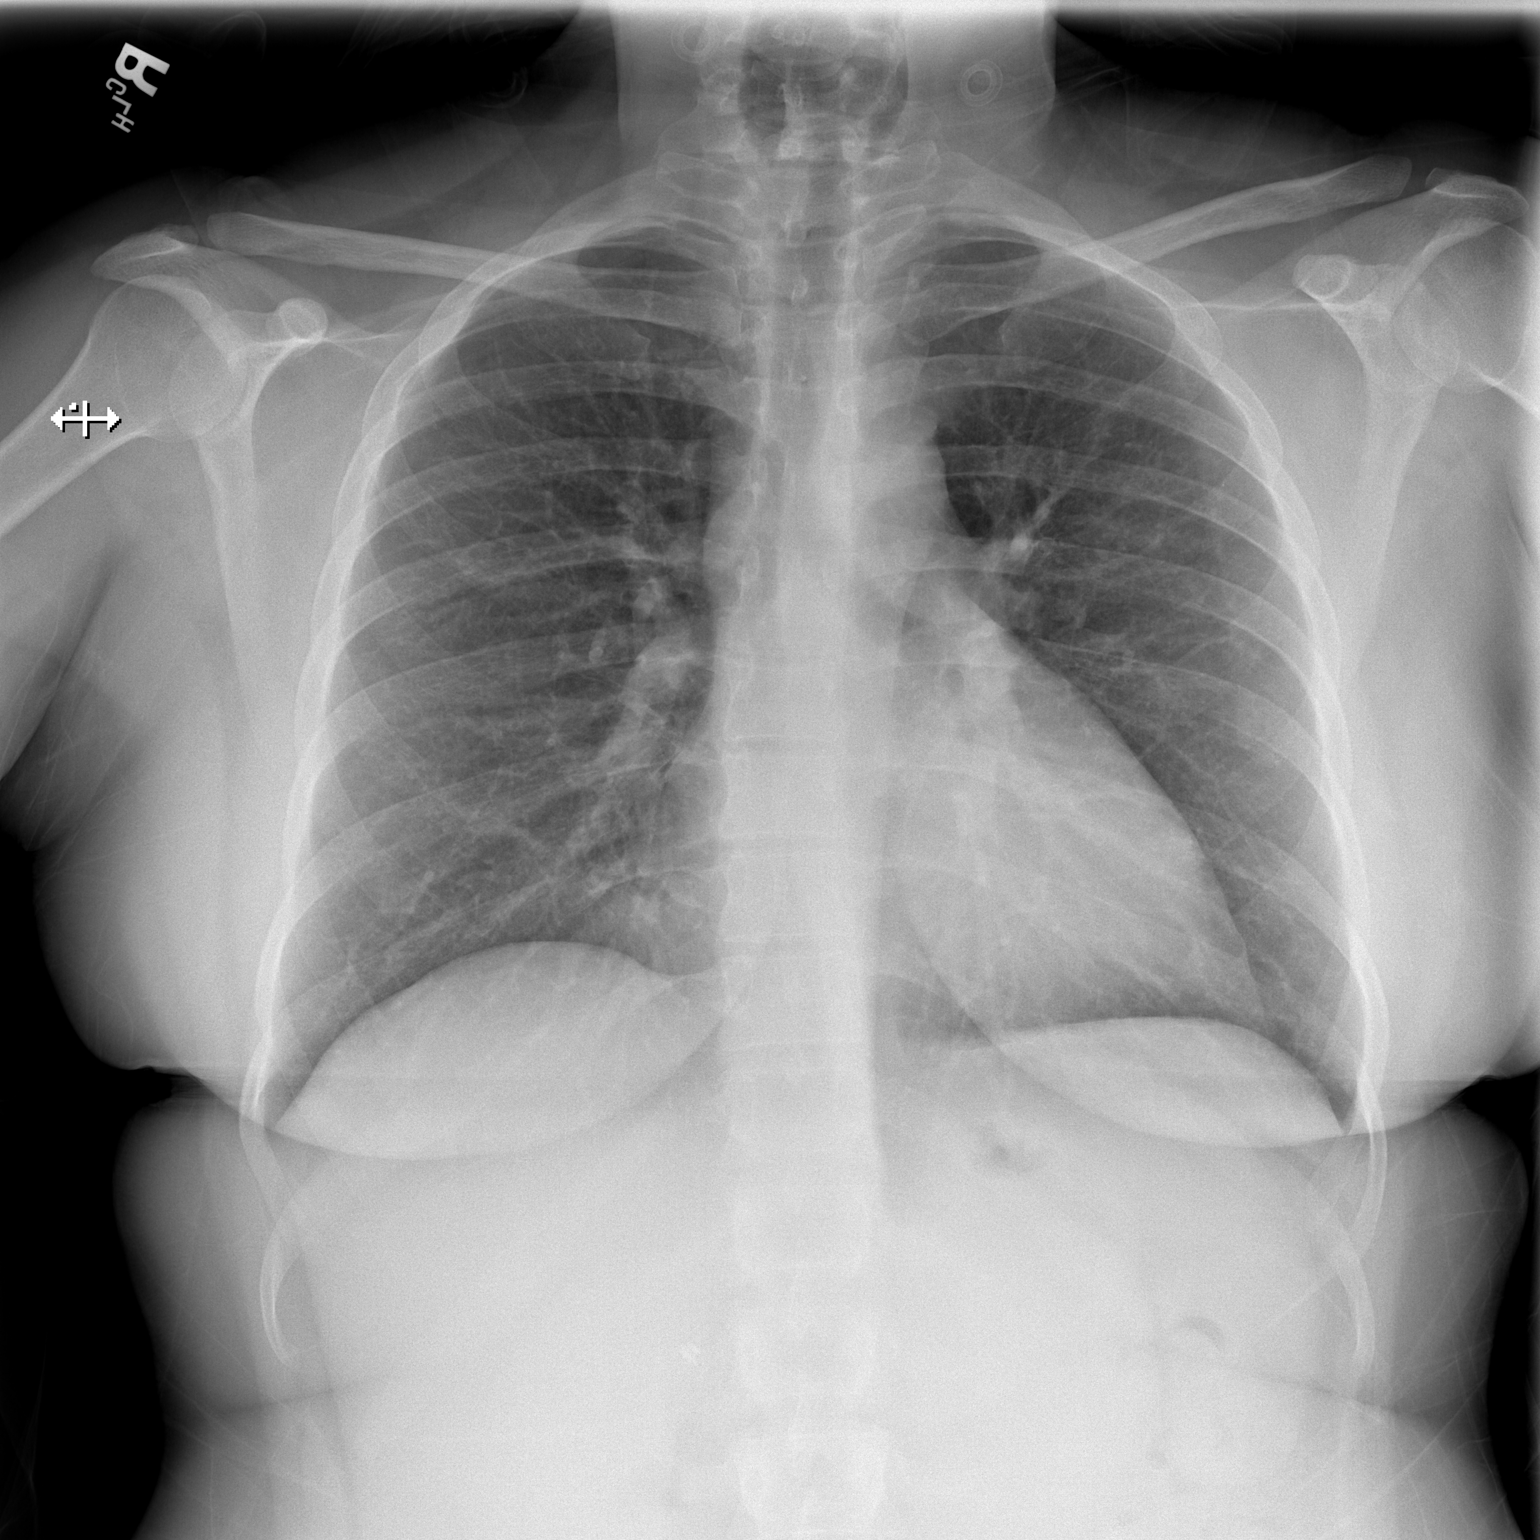

[w chest lat]
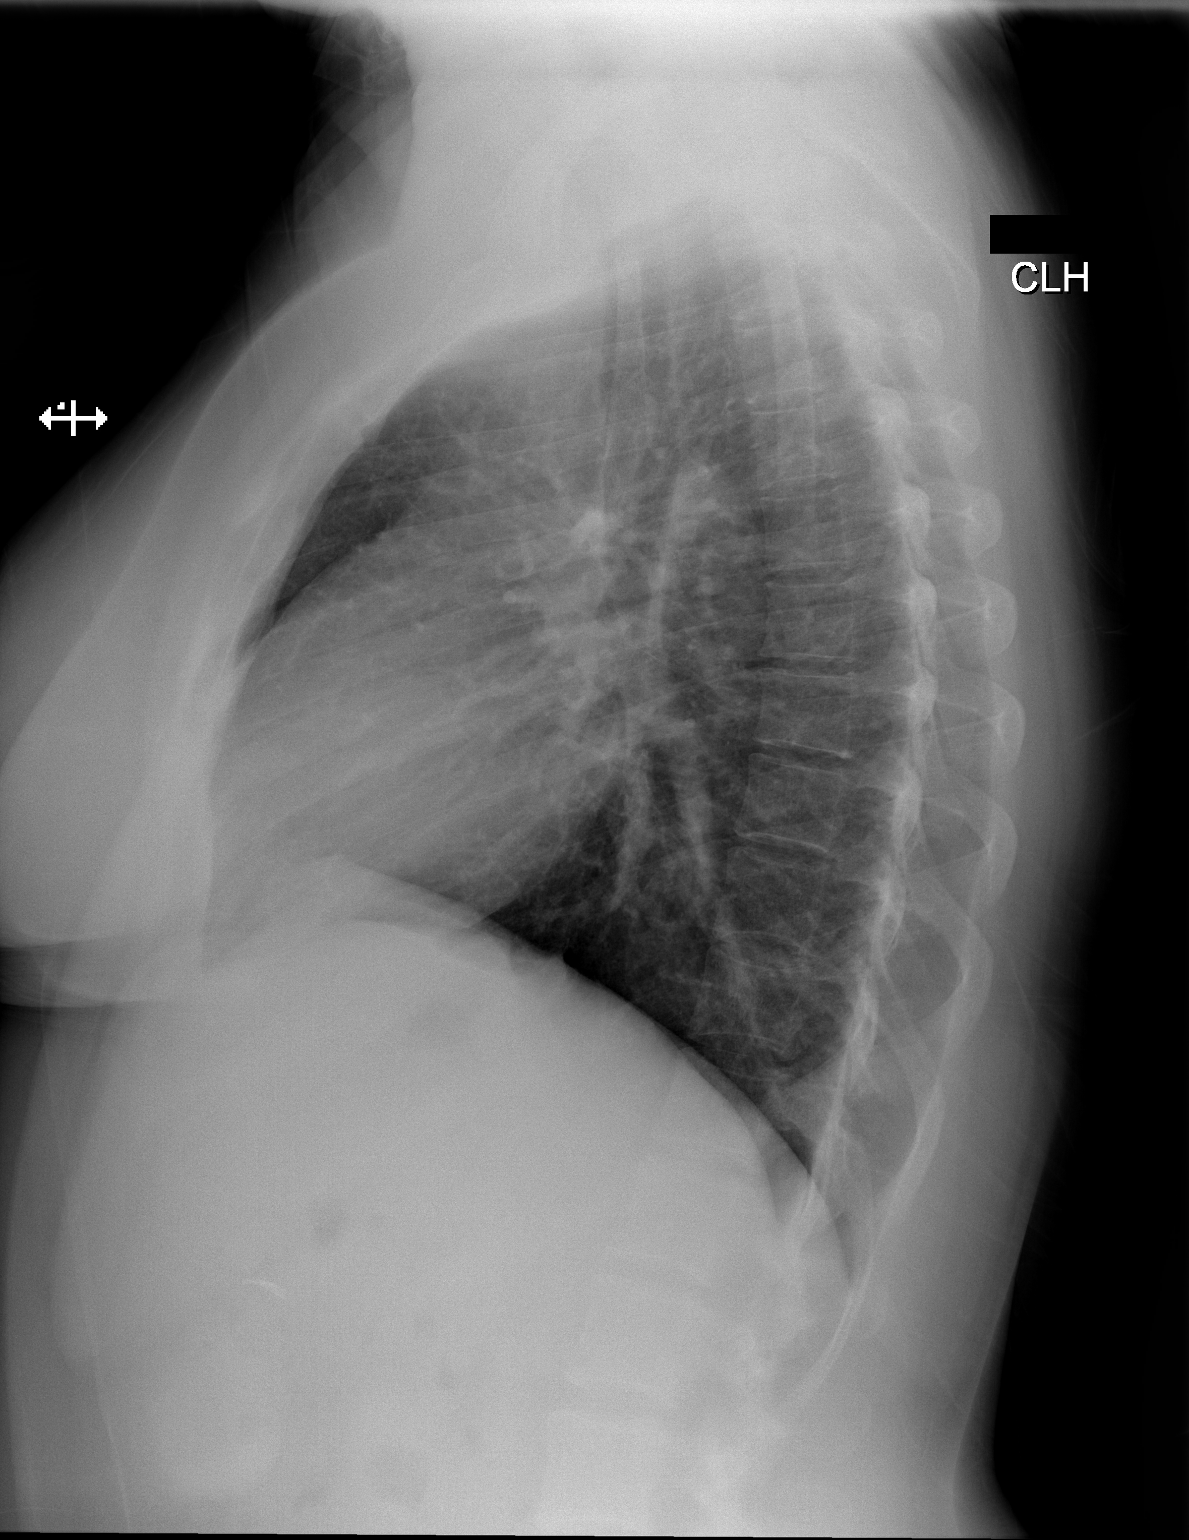

[2 of 2 positions shown; findings below may reference images not displayed]

FINDINGS: Heart and mediastinal contours are within normal limits. No focal
opacities or effusions. No acute bony abnormality.
IMPRESSION: No active cardiopulmonary disease.

## 2019-01-12 ENCOUNTER — Encounter (HOSPITAL_COMMUNITY): Payer: Self-pay | Admitting: Emergency Medicine

## 2019-01-12 ENCOUNTER — Ambulatory Visit (HOSPITAL_COMMUNITY)
Admission: EM | Admit: 2019-01-12 | Discharge: 2019-01-12 | Disposition: A | Discharge status: Home / Self Care | Payer: Medicaid Other | Attending: Emergency Medicine | Admitting: Emergency Medicine

## 2019-01-12 ENCOUNTER — Other Ambulatory Visit: Payer: Self-pay

## 2019-01-12 DIAGNOSIS — J45909 Unspecified asthma, uncomplicated: Secondary | ICD-10-CM

## 2019-01-12 DIAGNOSIS — Z760 Encounter for issue of repeat prescription: Secondary | ICD-10-CM

## 2019-01-12 DIAGNOSIS — F419 Anxiety disorder, unspecified: Secondary | ICD-10-CM

## 2019-01-12 DIAGNOSIS — I10 Essential (primary) hypertension: Secondary | ICD-10-CM | POA: Diagnosis not present

## 2019-01-12 MED ORDER — AMLODIPINE BESYLATE 5 MG PO TABS: 5.0000 mg | ORAL_TABLET | Freq: Every day | ORAL | 0 refills | Status: DC

## 2019-01-12 MED ORDER — ALBUTEROL SULFATE HFA 108 (90 BASE) MCG/ACT IN AERS: 2.0000 | INHALATION_SPRAY | RESPIRATORY_TRACT | 0 refills | Status: DC | PRN

## 2019-01-12 MED ORDER — AEROCHAMBER PLUS MISC: 2 refills | Status: DC

## 2019-01-12 MED ORDER — HYDROXYZINE HCL 50 MG PO TABS: 50.0000 mg | ORAL_TABLET | Freq: Four times a day (QID) | ORAL | 0 refills | Status: DC | PRN

## 2019-01-12 NOTE — ED Triage Notes (Signed)
PT requests refill for amlodipine and hydroxyzine.   Also reports she had a seizure last week that lasted for 20 minutes. She did not go to ED or see neuro. This was her first seizure in 3 years.   PT reports she not have a PCP or neurologist and needs help finding one.

## 2019-01-12 NOTE — ED Provider Notes (Signed)
HPI  SUBJECTIVE:  Shannon Castillo is a 38 y.o. female who presents for a refill of her blood pressure and anxiety medicines.  States that she ran out of both a little more than a week ago.  She currently has no complaints.  No headache, chest pain, dysarthria, visual changes, facial drooping, slurred speech, abdominal pain, lower extremity edema, anuria, hematuria.  She also reports having a 20-minute seizure witnessed by her husband 5 days ago.  States that she has felt "a little shaky" since, but has not had any repeat seizures.  She states her seizures are triggered by anxiety.  She states that she needs help finding a primary care physician and neurologist.  She has a past medical history of seizures, is on Lamictal for this, and is compliant with this.  states that she has plenty of this and does not need a refill.  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  PMD: None.    Past Medical History:  Diagnosis Date  . Asthma   . Hypertension   . Seizures (HCC)   . Stroke [facility](HCC)    x2    Past Surgical History:  Procedure Laterality Date  . BRAIN SURGERY      Family History  Problem Relation Age of Onset  . Cancer Mother     Social History   Tobacco Use  . Smoking status: Current Every Day Smoker    Packs/day: 0.50  . Smokeless tobacco: Never Used  Substance Use Topics  . Alcohol use: Not Currently    Comment: occ  . Drug use: Yes    Types: Marijuana    No current facility-administered medications for this encounter.   Current Outpatient Medications:  .  ARIPiprazole (ABILIFY) 30 MG tablet, Take 30 mg by mouth daily., Disp: , Rfl: 0 .  aspirin-acetaminophen-caffeine (EXCEDRIN MIGRAINE) 250-250-65 MG tablet, Take 2 tablets by mouth every 6 (six) hours as needed for headache or migraine., Disp: , Rfl:  .  budesonide-formoterol (SYMBICORT) 160-4.5 MCG/ACT inhaler, Inhale 2 puffs into the lungs 2 (two) times daily., Disp: ,  Rfl:  .  gabapentin (NEURONTIN) 800 MG tablet, Take 800 mg by mouth 3 (three) times daily., Disp: , Rfl: 2 .  lamoTRIgine (LAMICTAL) 100 MG tablet, Take 100 mg by mouth 2 (two) times daily., Disp: , Rfl: 2 .  pantoprazole (PROTONIX) 20 MG tablet, Take 20 mg by mouth daily., Disp: , Rfl: 2 .  rOPINIRole (REQUIP) 1 MG tablet, Take 1 mg by mouth at bedtime., Disp: , Rfl: 2 .  albuterol (VENTOLIN HFA) 108 (90 Base) MCG/ACT inhaler, Inhale 2 puffs into the lungs every 4 (four) hours as needed for wheezing or shortness of breath., Disp: 18 g, Rfl: 0 .  amLODipine (NORVASC) 5 MG tablet, Take 1 tablet (5 mg total) by mouth daily., Disp: 30 tablet, Rfl: 0 .  hydrOXYzine (ATARAX/VISTARIL) 50 MG tablet, Take 1 tablet (50 mg total) by mouth every 6 (six) hours as needed for anxiety., Disp: 30 tablet, Rfl: 0 .  ibuprofen (ADVIL,MOTRIN) 200 MG tablet, Take 800 mg by mouth every 6 (six) hours as needed for headache (pain)., Disp: , Rfl:  .  Spacer/Aero-Holding Chambers (AEROCHAMBER PLUS) inhaler, Use as instructed, Disp: 1 each, Rfl: 2 .  SUMAtriptan (IMITREX) 100 MG tablet, Take 100 mg by mouth as needed for migraine., Disp: , Rfl: 1  Allergies  Allergen Reactions  . Penicillins Anaphylaxis    Has  patient had a PCN reaction causing immediate rash, facial/tongue/throat swelling, SOB or lightheadedness with hypotension: Yes Has patient had a PCN reaction causing severe rash involving mucus membranes or skin necrosis: No Has patient had a PCN reaction that required hospitalization: No Has patient had a PCN reaction occurring within the last 10 years: No If all of the above answers are "NO", then may proceed with Cephalosporin use.     ROS  As noted in HPI.   Physical Exam  BP (!) 141/86 (BP Location: Left Arm)   Pulse 81   Temp 98 F (36.7 C) (Temporal)   Resp 16   LMP 12/15/2018   SpO2 99%   Constitutional: Well developed, well nourished, no acute distress Eyes:  EOMI, conjunctiva normal  bilaterally HENT: Normocephalic, atraumatic,mucus membranes moist Respiratory: Normal inspiratory effort, lungs clear bilaterally Cardiovascular: Normal rate, regular rhythm, no murmurs rubs or gallops GI: nondistended skin: No rash, skin intact Musculoskeletal: no deformities Neurologic: Alert & oriented x 3, no focal neuro deficits.  Cranial nerves III through XII grossly intact. Psychiatric: Speech and behavior appropriate   ED Course   Medications - No data to display  No orders of the defined types were placed in this encounter.   No results found for this or any previous visit (from the past 24 hour(s)). No results found.  ED Clinical Impression  1. Medication refill   2. Anxiety   3. Uncomplicated asthma, unspecified asthma severity, unspecified whether persistent   4. Essential hypertension      ED Assessment/Plan  Patient had a seizure 5 days ago.  Thinks that this was triggered by anxiety.  Patient is neurologically intact, has had no further seizures since then, advised her that if she has another one, she will need to go to the emergency department.  Patient currently without complaints.  We will refill her amlodipine, hydroxyzine and also give her an inhaler with a spacer for her asthma.  Providing primary care list, placing primary care order for assistance, have her follow-up with [facility] neurology.   Also have her keep a log of her blood pressure, measure it once a day, and bring this in with her  blood pressure cuff to her next doctor's visit.  She may return here in a week if she has not found a primary care physician by then for recheck of her blood pressure.  Discussed MDM, treatment plan, and plan for follow-up with patient. Discussed sn/sx that should prompt return to the ED. patient agrees with plan.   Meds ordered this encounter  Medications  . albuterol (VENTOLIN HFA) 108 (90 Base) MCG/ACT inhaler    Sig: Inhale 2 puffs into the lungs every 4 (four)  hours as needed for wheezing or shortness of breath.    Dispense:  18 g    Refill:  0  . amLODipine (NORVASC) 5 MG tablet    Sig: Take 1 tablet (5 mg total) by mouth daily.    Dispense:  30 tablet    Refill:  0  . hydrOXYzine (ATARAX/VISTARIL) 50 MG tablet    Sig: Take 1 tablet (50 mg total) by mouth every 6 (six) hours as needed for anxiety.    Dispense:  30 tablet    Refill:  0  . Spacer/Aero-Holding Chambers (AEROCHAMBER PLUS) inhaler    Sig: Use as instructed    Dispense:  1 each    Refill:  2    *This clinic note was created using Dragon dictation software. Therefore, there  may be occasional mistakes despite careful proofreading.   ?   Domenick GongMortenson, Ailany Koren, MD 01/12/19 1705

## 2019-01-12 NOTE — Discharge Instructions (Signed)
Decrease your salt intake. diet and exercise will lower your blood pressure significantly. It is important to keep your blood pressure under good control, as having a elevated blood pressure for prolonged periods of time significantly increases your risk of stroke, heart attacks, kidney damage, eye damage, and other problems. Measure your blood pressure once a day, preferably at the same time every day. Keep a log of this and bring it to your next doctor's appointment.  Bring your blood pressure cuff as well.  Return here in a week for blood pressure recheck if you're unable to find a primary care physician by then. Return immediately to the ER if you start having chest pain, headache, problems seeing, problems talking, problems walking, if you feel like you're about to pass out, if you do pass out, if you have another seizure, or for any other concerns.  Below is a list of primary care practices who are taking new patients for you to follow-up with.  [facility] at [facility] [address] [place], [place] [address] [phone]  [place], [place] [address] [phone]  [facility]Internal Medicine [phone] [place] [address]  [facility]: [place]  [phone]  [place] [facility]: [place]   [phone]  [facility] Family Medicine: [address] [place]  [phone]  [place] primary care : [address] [place] [phone]  [facility] Primary Care: [address] [place] [address] [phone]  [facility]: [place] [phone]  Dr. Blanchie Serve [place]  [phone]  Dr. Benito Mccreedy, [facility]. [place], [place] [address]  [phone]  Go to www.goodrx.com to look up your medications. This will give you a list of where you can find your prescriptions at the most affordable prices. Or ask the pharmacist what the cash price is, or if they have any other discount programs available to help make your medication more affordable. This can be less expensive than what you would pay with insurance.

## 2019-01-27 ENCOUNTER — Encounter (HOSPITAL_COMMUNITY): Payer: Self-pay | Admitting: Emergency Medicine

## 2019-01-27 ENCOUNTER — Ambulatory Visit (HOSPITAL_COMMUNITY)
Admission: EM | Admit: 2019-01-27 | Discharge: 2019-01-27 | Disposition: A | Discharge status: Home / Self Care | Payer: Medicaid Other | Attending: Family Medicine | Admitting: Family Medicine

## 2019-01-27 ENCOUNTER — Other Ambulatory Visit: Payer: Self-pay

## 2019-01-27 DIAGNOSIS — Z760 Encounter for issue of repeat prescription: Secondary | ICD-10-CM

## 2019-01-27 MED ORDER — BUTALBITAL-APAP-CAFFEINE 50-325-40 MG PO TABS: 1.0000 | ORAL_TABLET | Freq: Four times a day (QID) | ORAL | 0 refills | Status: DC | PRN

## 2019-01-27 MED ORDER — ARIPIPRAZOLE 30 MG PO TABS: 30.0000 mg | ORAL_TABLET | Freq: Every day | ORAL | 0 refills | Status: DC

## 2019-01-27 NOTE — Discharge Instructions (Signed)
Medicines refilled Return as needed 

## 2019-01-27 NOTE — ED Provider Notes (Signed)
[facility]    CSN: [id number] Arrival date & time: 01/27/19  1316      History   Chief Complaint Chief Complaint  Patient presents with  . Medication Refill    HPI Aura CampsJennifer Castillo is a 38 y.o. female.   HPI  Patient was seen here on 01/12/2019.  She needed medicine refills.  She was seen by Dr. Terrilee CroakMortensen.  Essential medicines were refilled at that time.  Her blood pressure was elevated.  She was given instructions.  Patient has attempted to get a primary care doctor.  She has an appointment scheduled for a couple weeks from now.  She is here today because she is getting low on Abilify.  She needs to stay on this medication.  She is requesting a refill to last until she can see a PCP.  She also asks if she can have Fioricet for migraine pain.  I did check the [employer] [place] database.  Refill appears appropriate  Past Medical History:  Diagnosis Date  . Asthma   . Hypertension   . Seizures (HCC)   . Stroke [facility](HCC)    x2    There are no active problems to display for this patient.   Past Surgical History:  Procedure Laterality Date  . BRAIN SURGERY      OB History   No obstetric history on file.      Home Medications    Prior to Admission medications   Medication Sig Start Date End Date Taking? Authorizing Provider  albuterol (VENTOLIN HFA) 108 (90 Base) MCG/ACT inhaler Inhale 2 puffs into the lungs every 4 (four) hours as needed for wheezing or shortness of breath. 01/12/19  Yes Domenick GongMortenson, Ashley, MD  amLODipine (NORVASC) 5 MG tablet Take 1 tablet (5 mg total) by mouth daily. 01/12/19  Yes Domenick GongMortenson, Ashley, MD  budesonide-formoterol [facility](SYMBICORT) 160-4.5 MCG/ACT inhaler Inhale 2 puffs into the lungs 2 (two) times daily.   Yes [provider]  gabapentin (NEURONTIN) 800 MG tablet Take 800 mg by mouth 3 (three) times daily. 09/21/17  Yes [provider]  hydrOXYzine (ATARAX/VISTARIL) 50 MG tablet Take 1 tablet (50 mg total) by mouth every 6 (six)  hours as needed for anxiety. 01/12/19 05/12/19 Yes Domenick GongMortenson, Ashley, MD  lamoTRIgine (LAMICTAL) 100 MG tablet Take 100 mg by mouth 2 (two) times daily. 09/21/17  Yes [provider]  rOPINIRole (REQUIP) 1 MG tablet Take 1 mg by mouth at bedtime. 09/21/17  Yes [provider]  Spacer/Aero-Holding Chambers (AEROCHAMBER PLUS) inhaler Use as instructed 01/12/19  Yes Domenick GongMortenson, Ashley, MD  ARIPiprazole (ABILIFY) 30 MG tablet Take 1 tablet (30 mg total) by mouth daily. 01/27/19   [name], [name], MD  aspirin-acetaminophen-caffeine (EXCEDRIN MIGRAINE) [phone] MG tablet Take 2 tablets by mouth every 6 (six) hours as needed for headache or migraine.    [provider]  butalbital-acetaminophen-caffeine (FIORICET) [phone] MG tablet Take 1 tablet by mouth every 6 (six) hours as needed for headache. 01/27/19   [name], Rakeya Glab Sue, MD  ibuprofen (ADVIL,MOTRIN) 200 MG tablet Take 800 mg by mouth every 6 (six) hours as needed for headache (pain).    [provider]  pantoprazole (PROTONIX) 20 MG tablet Take 20 mg by mouth daily. 09/21/17   [provider]  SUMAtriptan (IMITREX) 100 MG tablet Take 100 mg by mouth as needed for migraine. 10/05/17   [provider]    Family History Family History  Problem Relation Age of Onset  . Cancer Mother  Social History Social History   Tobacco Use  . Smoking status: Current Every Day Smoker    Packs/day: 0.50  . Smokeless tobacco: Never Used  Substance Use Topics  . Alcohol use: Not Currently    Comment: occ  . Drug use: Yes    Types: Marijuana     Allergies   Penicillins   Review of Systems Review of Systems  Constitutional: Negative for chills and fever.  HENT: Negative for ear pain and sore throat.   Eyes: Negative for pain and visual disturbance.  Respiratory: Negative for cough and shortness of breath.   Cardiovascular: Negative for chest pain and palpitations.  Gastrointestinal: Negative  for abdominal pain and vomiting.  Genitourinary: Negative for dysuria and hematuria.  Musculoskeletal: Negative for arthralgias and back pain.  Skin: Negative for color change and rash.  Neurological: Positive for headaches. Negative for seizures and syncope.       Chronic  All other systems reviewed and are negative.    Physical Exam Triage Vital Signs ED Triage Vitals  Enc Vitals Group     BP 01/27/19 1342 120/85     Pulse Rate 01/27/19 1342 98     Resp 01/27/19 1342 18     Temp 01/27/19 1342 98.1 F (36.7 C)     Temp Source 01/27/19 1342 Oral     SpO2 01/27/19 1342 97 %     Weight --      Height --      Head Circumference --      Peak Flow --      Pain Score 01/27/19 1339 0     Pain Loc --      Pain Edu? --      Excl. in [place]? --    No data found.  Updated Vital Signs BP 120/85 (BP Location: Right Arm)   Pulse 98   Temp 98.1 F (36.7 C) (Oral)   Resp 18   LMP 01/20/2019 (Approximate)   SpO2 97%   Visual Acuity Right Eye Distance:   Left Eye Distance:   Bilateral Distance:    Right Eye Near:   Left Eye Near:    Bilateral Near:     Physical Exam Constitutional:      General: She is not in acute distress.    Appearance: Normal appearance. She is well-developed.     Comments: Overweight.  Polite.  HENT:     Head: Normocephalic and atraumatic.  Eyes:     Conjunctiva/sclera: Conjunctivae normal.     Pupils: Pupils are equal, round, and reactive to light.  Neck:     Musculoskeletal: Normal range of motion.  Cardiovascular:     Rate and Rhythm: Normal rate.  Pulmonary:     Effort: Pulmonary effort is normal. No respiratory distress.  Abdominal:     General: There is no distension.     Palpations: Abdomen is soft.  Musculoskeletal: Normal range of motion.  Skin:    General: Skin is warm and dry.  Neurological:     Mental Status: She is alert.  Psychiatric:        Mood and Affect: Mood normal.        Behavior: Behavior normal.      UC Treatments  / Results  Labs (all labs ordered are listed, but only abnormal results are displayed) Labs Reviewed - No data to display  EKG   Radiology No results found.  Procedures Procedures (including critical care time)  Medications Ordered in UC Medications -  No data to display  Initial Impression / Assessment and Plan / UC Course  I have reviewed the triage vital signs and the nursing notes.  Pertinent labs & imaging results that were available during my care of the patient were reviewed by me and considered in my medical decision making (see chart for details).     Medicine refill.  Discussed importance of PCP. Final Clinical Impressions(s) / UC Diagnoses   Final diagnoses:  Medication refill     Discharge Instructions     Medicines refilled Return as needed   ED Prescriptions    Medication Sig Dispense Auth. Provider   ARIPiprazole (ABILIFY) 30 MG tablet Take 1 tablet (30 mg total) by mouth daily. 30 tablet [name], Patt Steinhardt Sue, MD   butalbital-acetaminophen-caffeine (FIORICET) [phone] MG tablet Take 1 tablet by mouth every 6 (six) hours as needed for headache. 14 tablet [name], Koleson Reifsteck Sue, MD     Controlled Substance Prescriptions [place] Controlled Substance Registry consulted? Yes, I have consulted the [place] Controlled Substances Registry for this patient, and feel the risk/benefit ratio today is favorable for proceeding with this prescription for a controlled substance.   [name], Zhi Geier Sue, MD 01/27/19 1736

## 2019-01-27 NOTE — ED Triage Notes (Signed)
Pt here for refill of Abilify.  Pt states that since the last time she was here, she set up an appointment for a new PCP at [facility], but it is not until Sept. 2.  Pt denies anymore seizures.

## 2019-02-16 ENCOUNTER — Ambulatory Visit (INDEPENDENT_AMBULATORY_CARE_PROVIDER_SITE_OTHER): Payer: Medicaid Other | Admitting: Nurse Practitioner

## 2019-02-16 DIAGNOSIS — M797 Fibromyalgia: Secondary | ICD-10-CM | POA: Diagnosis not present

## 2019-02-16 DIAGNOSIS — Z130 Encounter for screening for diseases of the blood and blood-forming organs and certain disorders involving the immune mechanism: Secondary | ICD-10-CM

## 2019-02-16 DIAGNOSIS — F259 Schizoaffective disorder, unspecified: Secondary | ICD-10-CM | POA: Insufficient documentation

## 2019-02-16 DIAGNOSIS — F411 Generalized anxiety disorder: Secondary | ICD-10-CM

## 2019-02-16 DIAGNOSIS — Z13220 Encounter for screening for lipoid disorders: Secondary | ICD-10-CM

## 2019-02-16 DIAGNOSIS — G40909 Epilepsy, unspecified, not intractable, without status epilepticus: Secondary | ICD-10-CM | POA: Insufficient documentation

## 2019-02-16 DIAGNOSIS — I10 Essential (primary) hypertension: Secondary | ICD-10-CM | POA: Diagnosis not present

## 2019-02-16 DIAGNOSIS — Z131 Encounter for screening for diabetes mellitus: Secondary | ICD-10-CM

## 2019-02-16 MED ORDER — GABAPENTIN 800 MG PO TABS: 800.0000 mg | ORAL_TABLET | Freq: Three times a day (TID) | ORAL | 3 refills | Status: DC

## 2019-02-16 MED ORDER — BLOOD PRESSURE MONITOR DEVI: 0 refills | Status: AC

## 2019-02-16 MED ORDER — HYDROXYZINE HCL 50 MG PO TABS: 50.0000 mg | ORAL_TABLET | Freq: Four times a day (QID) | ORAL | 1 refills | Status: DC | PRN

## 2019-02-16 MED ORDER — AMLODIPINE BESYLATE 5 MG PO TABS: 5.0000 mg | ORAL_TABLET | Freq: Every day | ORAL | 0 refills | Status: DC

## 2019-02-16 MED ORDER — ARIPIPRAZOLE 30 MG PO TABS: 30.0000 mg | ORAL_TABLET | Freq: Every day | ORAL | 0 refills | Status: DC

## 2019-02-16 NOTE — Progress Notes (Signed)
Patient doesn't check BP at home. Denies chest pain, SHOB, palpitations, dizziness. Does have c/o headaches & swelling in her feet/ankles. Has been out of her Amlodipine.  States that she is almost out of her Abilify.

## 2019-02-16 NOTE — Progress Notes (Signed)
Virtual Visit via Telephone Note Due to national recommendations of social distancing due to [place] 19, telehealth visit is felt to be most appropriate for this patient at this time.  I discussed the limitations, risks, security and privacy concerns of performing an evaluation and management service by telephone and the availability of in person appointments. I also discussed with the patient that there may be a patient responsible charge related to this service. The patient expressed understanding and agreed to proceed.    I connected with Warner Mccreedy on 02/16/19  at   2:10 PM EDT  EDT by telephone and verified that I am speaking with the correct person using two identifiers.   Consent I discussed the limitations, risks, security and privacy concerns of performing an evaluation and management service by telephone and the availability of in person appointments. I also discussed with the patient that there may be a patient responsible charge related to this service. The patient expressed understanding and agreed to proceed.   Location of Patient: Private Residence    Location of Provider: [place] and [employer] Office    Persons participating in Telemedicine visit: Geryl Rankins FNP-BC [place]    History of Present Illness: Telemedicine visit for: Establish care  has a past medical history of Asthma, AVM (arteriovenous malformation) brain, Bipolar disorder ([place]), Fibromyalgia, Hypertension, Migraine headache, Schizoaffective disorder ([place]), Seizures ([place]), and Stroke ([place]). She moved here from [place] 3 to 4 years ago and is just now establishing care with a primary care provider.  She has frequent emergency room visits for medication refills.  Not very detailed with her past medical history however she does note she had a stroke in 2007, repair of AVM and also has a history of seizure disorder.  She has seen a neurologist in the past.  She will need  to be referred to a new neurologist in [place].  Essential Hypertension She has been out of her amlodipine for several weeks.  She does not monitor her blood pressure at home. Denies chest pain, shortness of breath, palpitations, lightheadedness, dizziness or BLE edema.  BP Readings from Last 3 Encounters:  01/27/19 120/85  01/12/19 (!) 141/86  04/19/18 111/76    Bipolar and Schizoaffective disorder  She has been taking Abilify. Was previously prescribed risperdal prior to abilify.  She will need to be referred to psychiatry for continuous refills.  I have instructed her that I will provide a one-month courtesy refill and that she will need to follow-up titrating there after for additional refills of Abilify.    Seizures Taking Lamictal 100 mg twice daily.  Her last noted seizure  was 3 weeks ago.    Fibromyalgia She takes gabapentin 800 mg 3 times daily.  Past Medical History:  Diagnosis Date  . Asthma   . AVM (arteriovenous malformation) brain   . Bipolar disorder ([place])   . Fibromyalgia   . Hypertension   . Migraine headache   . Schizoaffective disorder ([place])   . Seizures ([place])   . Stroke [facility])    x2    Past Surgical History:  Procedure Laterality Date  . BRAIN SURGERY      Family History  Problem Relation Age of Onset  . Cancer Mother     Social History   Socioeconomic History  . Marital status: Married    Spouse name: Not on file  . Number of children: Not on file  . Years of education: Not on file  .  Highest education level: Not on file  Occupational History  . Not on file  Social Needs  . Financial resource strain: Not on file  . Food insecurity    Worry: Not on file    Inability: Not on file  . Transportation needs    Medical: Not on file    Non-medical: Not on file  Tobacco Use  . Smoking status: Current Every Day Smoker    Packs/day: 0.50  . Smokeless tobacco: Never Used  Substance and Sexual Activity  . Alcohol use: Not Currently     Comment: occ  . Drug use: Yes    Types: Marijuana  . Sexual activity: Not on file  Lifestyle  . Physical activity    Days per week: Not on file    Minutes per session: Not on file  . Stress: Not on file  Relationships  . Social Herbalist on phone: Not on file    Gets together: Not on file    Attends religious service: Not on file    Active member of club or organization: Not on file    Attends meetings of clubs or organizations: Not on file    Relationship status: Not on file  Other Topics Concern  . Not on file  Social History Narrative  . Not on file     Observations/Objective: Awake, alert and oriented x 3   Review of Systems  Constitutional: Negative for fever, malaise/fatigue and weight loss.  HENT: Negative.  Negative for nosebleeds.   Eyes: Negative.  Negative for blurred vision, double vision and photophobia.  Respiratory: Negative.  Negative for cough and shortness of breath.   Cardiovascular: Negative.  Negative for chest pain, palpitations and leg swelling.  Gastrointestinal: Negative.  Negative for heartburn, nausea and vomiting.  Musculoskeletal: Positive for myalgias (Fibromyalgia).  Neurological: Positive for seizures and headaches. Negative for dizziness and focal weakness.  Psychiatric/Behavioral: Positive for depression. Negative for suicidal ideas. The patient is nervous/anxious.     Assessment and Plan: Shantrice was seen today for establish care, hypertension and anxiety.  Diagnoses and all orders for this visit:  Seizure disorder [facility]) -     Ambulatory referral to Neurology -     Lamotrigine level; Future -     CMP14+EGFR; Future -     TSH; Future  Essential hypertension -     amLODipine (NORVASC) 5 MG tablet; Take 1 tablet (5 mg total) by mouth daily. -     Blood Pressure Monitor DEVI; Please provide patient with insurance approved blood pressure monitor -     CMP14+EGFR; Future  Screening for deficiency anemia -     CBC;  Future  Lipid screening -     Lipid panel; Future  Diabetes mellitus screening -     Hemoglobin A1c; Future  Fibromyalgia -     gabapentin (NEURONTIN) 800 MG tablet; Take 1 tablet (800 mg total) by mouth 3 (three) times daily.  Anxiety state -     hydrOXYzine (ATARAX/VISTARIL) 50 MG tablet; Take 1 tablet (50 mg total) by mouth every 6 (six) hours as needed for anxiety.  Schizoaffective disorder, unspecified type (HCC) -     ARIPiprazole (ABILIFY) 30 MG tablet; Take 1 tablet (30 mg total) by mouth daily.     Follow Up Instructions Return in about 6 weeks (around 03/30/2019) for PAP SMEAR.     I discussed the assessment and treatment plan with the patient. The patient was provided an opportunity to ask  questions and all were answered. The patient agreed with the plan and demonstrated an understanding of the instructions.   The patient was advised to call back or seek an in-person evaluation if the symptoms worsen or if the condition fails to improve as anticipated.  I provided 24 minutes of non-face-to-face time during this encounter including median intraservice time, reviewing previous notes, labs, imaging, medications and explaining diagnosis and management.  Gildardo Pounds, FNP-BC

## 2019-02-22 ENCOUNTER — Other Ambulatory Visit: Payer: Medicaid Other

## 2019-02-28 ENCOUNTER — Encounter: Payer: Self-pay | Admitting: General Practice

## 2019-02-28 ENCOUNTER — Other Ambulatory Visit: Payer: Self-pay | Admitting: Nurse Practitioner

## 2019-02-28 DIAGNOSIS — F411 Generalized anxiety disorder: Secondary | ICD-10-CM

## 2019-03-09 ENCOUNTER — Ambulatory Visit: Payer: Medicaid Other

## 2019-03-14 ENCOUNTER — Telehealth: Payer: Self-pay | Admitting: General Practice

## 2019-03-14 NOTE — Telephone Encounter (Signed)
1) Medication(s) Requested (by name): lamoTRIgine (LAMICTAL) 100 MG tablet [[id number]]   2) Pharmacy of Choice:  [place], [place]   Approved medications will be sent to pharmacy, we will reach out to you if there is an issue.  Requests made after 3pm may not be addressed until following business day!

## 2019-03-14 NOTE — Telephone Encounter (Signed)
Please advise.  Patient isn't scheduled to see Neurology until 04/11/2019

## 2019-03-15 ENCOUNTER — Other Ambulatory Visit: Payer: Self-pay | Admitting: Nurse Practitioner

## 2019-03-15 NOTE — Telephone Encounter (Signed)
Shannon Castillo can you verify with her pharmacy that she has been receiving this medication over the past 3 months. If it has not been refilled she can wait to be seen by Neurology and they can decide if it needs to be filled.

## 2019-03-16 ENCOUNTER — Other Ambulatory Visit: Payer: Self-pay

## 2019-03-16 ENCOUNTER — Encounter (HOSPITAL_COMMUNITY): Payer: Self-pay

## 2019-03-16 ENCOUNTER — Ambulatory Visit (HOSPITAL_COMMUNITY)
Admission: EM | Admit: 2019-03-16 | Discharge: 2019-03-16 | Disposition: A | Discharge status: Home / Self Care | Payer: Medicaid Other | Attending: Family Medicine | Admitting: Family Medicine

## 2019-03-16 DIAGNOSIS — G40909 Epilepsy, unspecified, not intractable, without status epilepticus: Secondary | ICD-10-CM | POA: Diagnosis not present

## 2019-03-16 DIAGNOSIS — F259 Schizoaffective disorder, unspecified: Secondary | ICD-10-CM

## 2019-03-16 DIAGNOSIS — I10 Essential (primary) hypertension: Secondary | ICD-10-CM

## 2019-03-16 MED ORDER — AMLODIPINE BESYLATE 5 MG PO TABS: 5.0000 mg | ORAL_TABLET | Freq: Every day | ORAL | 1 refills | Status: DC

## 2019-03-16 MED ORDER — LAMOTRIGINE 100 MG PO TABS: 100.0000 mg | ORAL_TABLET | Freq: Two times a day (BID) | ORAL | 1 refills | Status: DC

## 2019-03-16 NOTE — Telephone Encounter (Signed)
[facility]. They stated that they last time she had prescription filled with them was 11/25/2018, #60

## 2019-03-16 NOTE — ED Triage Notes (Signed)
Pt presents for medication refill.  

## 2019-03-16 NOTE — ED Provider Notes (Signed)
[place] [facility]   [id number] 03/16/19 Arrival Time: 1222  ASSESSMENT & PLAN:  1. Seizure disorder ([place])   2. Essential hypertension   3. Schizoaffective disorder, unspecified type ([place])   4. Uncontrolled hypertension      Reports that she has an upcoming appointment in October with her new neurologist.  Meds ordered this encounter  Medications  . amLODipine (NORVASC) 5 MG tablet    Sig: Take 1 tablet (5 mg total) by mouth daily.    Dispense:  30 tablet    Refill:  1  . lamoTRIgine (LAMICTAL) 100 MG tablet    Sig: Take 1 tablet (100 mg total) by mouth 2 (two) times daily.    Dispense:  60 tablet    Refill:  1    Follow-up Information    Schedule an appointment as soon as possible for a visit  with Primary Care at [facility].   Specialty: Family Medicine Contact information: [address] [place] [phone]        - to follow BP.  Reviewed expectations re: course of current medical issues. Questions answered. Outlined signs and symptoms indicating need for more acute intervention. Patient verbalized understanding. After Visit Summary given.   SUBJECTIVE:  Shannon Castillo is a 38 y.o. female who reports that she is running low on her HTN medication and is out of her seizure medication. Does not regularly check her BP. Is now being seen at [facility] No seizureswith concerns regarding increased blood pressures.  She reports taking medications as instructed, no medication side effects noted, no chest pain on exertion, no dyspnea on exertion, no swelling of ankles, no palpitations and no intermittent claudication symptoms.  Denies symptoms of chest pain, palpations, orthopnea, nocturnal dyspnea, or LE edema.  Social History   Tobacco Use  Smoking Status Current Every Day Smoker  . Packs/day: 0.50  Smokeless Tobacco Never Used   ROS: As per HPI. All other systems negative.   OBJECTIVE:  Vitals:    03/16/19 1353  BP: (!) 122/104  Pulse: 76  Resp: 17  Temp: 97.8 F (36.6 C)  TempSrc: Oral  SpO2: 99%    General appearance: alert; no distress Eyes: PERRLA; EOMI HENT: normocephalic; atraumatic Neck: supple Lungs: clear to auscultation bilaterally Heart: regular rate and rhythm without murmer Abdomen: soft, non-tender; bowel sounds normal Extremities: no edema; symmetrical with no gross deformities Skin: warm and dry Psychological: alert and cooperative; normal mood and affect   Allergies  Allergen Reactions  . Penicillins Anaphylaxis    Has patient had a PCN reaction causing immediate rash, facial/tongue/throat swelling, SOB or lightheadedness with hypotension: Yes Has patient had a PCN reaction causing severe rash involving mucus membranes or skin necrosis: No Has patient had a PCN reaction that required hospitalization: No Has patient had a PCN reaction occurring within the last 10 years: No If all of the above answers are "NO", then may proceed with Cephalosporin use.    Past Medical History:  Diagnosis Date  . Asthma   . AVM (arteriovenous malformation) brain   . Bipolar disorder ([place])   . Fibromyalgia   . Hypertension   . Migraine headache   . Schizoaffective disorder ([place])   . Seizures ([place])   . Stroke [facility])    x2   Social History   Socioeconomic History  . Marital status: Married    Spouse name: Not on file  . Number of children: Not on file  . Years  of education: Not on file  . Highest education level: Not on file  Occupational History  . Not on file  Social Needs  . Financial resource strain: Not on file  . Food insecurity    Worry: Not on file    Inability: Not on file  . Transportation needs    Medical: Not on file    Non-medical: Not on file  Tobacco Use  . Smoking status: Current Every Day Smoker    Packs/day: 0.50  . Smokeless tobacco: Never Used  Substance and Sexual Activity  . Alcohol use: Not Currently    Comment: occ  . Drug  use: Yes    Types: Marijuana  . Sexual activity: Not on file  Lifestyle  . Physical activity    Days per week: Not on file    Minutes per session: Not on file  . Stress: Not on file  Relationships  . Social Musician on phone: Not on file    Gets together: Not on file    Attends religious service: Not on file    Active member of club or organization: Not on file    Attends meetings of clubs or organizations: Not on file    Relationship status: Not on file  . Intimate partner violence    Fear of current or ex partner: Not on file    Emotionally abused: Not on file    Physically abused: Not on file    Forced sexual activity: Not on file  Other Topics Concern  . Not on file  Social History Narrative  . Not on file   Family History  Problem Relation Age of Onset  . Cancer Mother    Past Surgical History:  Procedure Laterality Date  . BRAIN SURGERY        Mardella Layman, MD 03/16/19 1415

## 2019-03-22 ENCOUNTER — Telehealth: Payer: Self-pay

## 2019-03-22 NOTE — Telephone Encounter (Signed)
Called patient to do their pre-visit COVID screening.  Call went to voicemail. Unable to do prescreening.  

## 2019-03-23 ENCOUNTER — Ambulatory Visit: Payer: Medicaid Other

## 2019-04-02 ENCOUNTER — Other Ambulatory Visit: Payer: Self-pay | Admitting: Nurse Practitioner

## 2019-04-02 DIAGNOSIS — F259 Schizoaffective disorder, unspecified: Secondary | ICD-10-CM

## 2019-04-04 ENCOUNTER — Other Ambulatory Visit: Payer: Self-pay | Admitting: Nurse Practitioner

## 2019-04-04 DIAGNOSIS — F319 Bipolar disorder, unspecified: Secondary | ICD-10-CM

## 2019-04-06 ENCOUNTER — Ambulatory Visit (HOSPITAL_COMMUNITY)
Admission: EM | Admit: 2019-04-06 | Discharge: 2019-04-06 | Disposition: A | Discharge status: Home / Self Care | Payer: Medicaid Other

## 2019-04-06 ENCOUNTER — Other Ambulatory Visit: Payer: Self-pay

## 2019-04-06 ENCOUNTER — Encounter (HOSPITAL_COMMUNITY): Payer: Self-pay | Admitting: Emergency Medicine

## 2019-04-06 DIAGNOSIS — F319 Bipolar disorder, unspecified: Secondary | ICD-10-CM | POA: Diagnosis not present

## 2019-04-06 DIAGNOSIS — K219 Gastro-esophageal reflux disease without esophagitis: Secondary | ICD-10-CM | POA: Diagnosis not present

## 2019-04-06 DIAGNOSIS — Z760 Encounter for issue of repeat prescription: Secondary | ICD-10-CM | POA: Diagnosis not present

## 2019-04-06 DIAGNOSIS — F259 Schizoaffective disorder, unspecified: Secondary | ICD-10-CM

## 2019-04-06 DIAGNOSIS — F419 Anxiety disorder, unspecified: Secondary | ICD-10-CM

## 2019-04-06 DIAGNOSIS — F411 Generalized anxiety disorder: Secondary | ICD-10-CM

## 2019-04-06 MED ORDER — PANTOPRAZOLE SODIUM 20 MG PO TBEC: 20.0000 mg | DELAYED_RELEASE_TABLET | Freq: Every day | ORAL | 0 refills | Status: DC

## 2019-04-06 MED ORDER — HYDROXYZINE HCL 50 MG PO TABS: 50.0000 mg | ORAL_TABLET | Freq: Four times a day (QID) | ORAL | 0 refills | Status: DC | PRN

## 2019-04-06 MED ORDER — ARIPIPRAZOLE 30 MG PO TABS: 30.0000 mg | ORAL_TABLET | Freq: Every day | ORAL | 0 refills | Status: DC

## 2019-04-06 NOTE — ED Triage Notes (Addendum)
PT needs protonix and abilify refill. PT sees neurology 10/26 and is hoping they will prescirbe long term. Also wants hydroxyzine

## 2019-04-06 NOTE — ED Provider Notes (Signed)
MRN: [id number] DOB: January 09, 1981  Subjective:   Shannon Castillo is a 38 y.o. female presenting for medication refill.  Patient has been on her medications really long-term.  She has an appointment set up for follow-up in long-term management on 04/11/2019 but is out of 3 of her medications.  She takes Abilify for her bipolar 1 disorder, anxiety and also uses hydroxyzine for this as needed.  She is also out of her acid reflux medicine, Protonix.  Patient is doing well otherwise but is very anxious about not having her medications. Denies SI, HI, hallucinations.   No current facility-administered medications for this encounter.   Current Outpatient Medications:  .  amLODipine (NORVASC) 5 MG tablet, Take 1 tablet (5 mg total) by mouth daily., Disp: 30 tablet, Rfl: 1 .  ARIPiprazole (ABILIFY) 30 MG tablet, TAKE 1 TABLET (30 MG TOTAL) BY MOUTH DAILY., Disp: 30 tablet, Rfl: 0 .  Blood Pressure Monitor DEVI, Please provide patient with insurance approved blood pressure monitor, Disp: 1 Device, Rfl: 0 .  butalbital-acetaminophen-caffeine (FIORICET) 50-325-40 MG tablet, Take 1 tablet by mouth every 6 (six) hours as needed for headache. (Patient not taking: Reported on 02/16/2019), Disp: 14 tablet, Rfl: 0 .  gabapentin (NEURONTIN) 800 MG tablet, Take 1 tablet (800 mg total) by mouth 3 (three) times daily., Disp: 90 tablet, Rfl: 3 .  hydrOXYzine (ATARAX/VISTARIL) 50 MG tablet, Take 1 tablet (50 mg total) by mouth every 6 (six) hours as needed for anxiety., Disp: 30 tablet, Rfl: 1 .  lamoTRIgine (LAMICTAL) 100 MG tablet, Take 1 tablet (100 mg total) by mouth 2 (two) times daily., Disp: 60 tablet, Rfl: 1 .  rOPINIRole (REQUIP) 1 MG tablet, Take 1 mg by mouth at bedtime., Disp: , Rfl: 2   Allergies  Allergen Reactions  . Penicillins Anaphylaxis    Has patient had a PCN reaction causing immediate rash, facial/tongue/throat swelling, SOB or lightheadedness with hypotension: Yes Has patient had a PCN reaction  causing severe rash involving mucus membranes or skin necrosis: No Has patient had a PCN reaction that required hospitalization: No Has patient had a PCN reaction occurring within the last 10 years: No If all of the above answers are "NO", then may proceed with Cephalosporin use.    Past Medical History:  Diagnosis Date  . Asthma   . AVM (arteriovenous malformation) brain   . Bipolar disorder ([place])   . Fibromyalgia   . Hypertension   . Migraine headache   . Schizoaffective disorder ([place])   . Seizures ([place])   . Stroke [facility])    x2     Past Surgical History:  Procedure Laterality Date  . BRAIN SURGERY      Social History   Tobacco Use  . Smoking status: Current Every Day Smoker    Packs/day: 0.50  . Smokeless tobacco: Never Used  Substance Use Topics  . Alcohol use: Not Currently    Comment: occ  . Drug use: Yes    Types: Marijuana    ROS  Objective:   Vitals: BP 134/90   Pulse 81   Temp 98.2 F (36.8 C) (Oral)   Resp 16   LMP 03/31/2019   SpO2 99%   Physical Exam Constitutional:      General: She is not in acute distress.    Appearance: Normal appearance. She is well-developed. She is not ill-appearing.  HENT:     Head: Normocephalic and atraumatic.     Nose: Nose normal.     Mouth/Throat:  Mouth: Mucous membranes are moist.     Pharynx: Oropharynx is clear.  Eyes:     General: No scleral icterus.    Extraocular Movements: Extraocular movements intact.     Pupils: Pupils are equal, round, and reactive to light.  Cardiovascular:     Rate and Rhythm: Normal rate.  Pulmonary:     Effort: Pulmonary effort is normal.  Skin:    General: Skin is warm and dry.  Neurological:     General: No focal deficit present.     Mental Status: She is alert and oriented to person, place, and time.  Psychiatric:        Mood and Affect: Mood normal.        Behavior: Behavior normal.        Thought Content: Thought content normal.        Judgment: Judgment  normal.       Assessment and Plan :   1. Medication refill   2. Anxiety   3. Gastroesophageal reflux disease, unspecified whether esophagitis present   4. Bipolar I disorder ([facility])   5. Schizoaffective disorder, unspecified type ([facility])   6. Anxiety state     Patient is very stable, medications refilled for 30-day supply.  She is to maintain her appointment is coming up on 04/11/2019. Counseled patient on potential for adverse effects with medications prescribed/recommended today, ER and return-to-clinic precautions discussed, patient verbalized understanding.    Wallis Bamberg, [place] 04/06/19 1437

## 2019-04-11 ENCOUNTER — Ambulatory Visit: Payer: Medicaid Other | Admitting: Neurology

## 2019-04-11 ENCOUNTER — Other Ambulatory Visit: Payer: Self-pay

## 2019-04-11 ENCOUNTER — Encounter: Payer: Self-pay | Admitting: Neurology

## 2019-04-11 VITALS — BP 151/94 | HR 96 | Temp 97.1°F | Ht 64.0 in | Wt 172.0 lb

## 2019-04-11 DIAGNOSIS — Z13220 Encounter for screening for lipoid disorders: Secondary | ICD-10-CM

## 2019-04-11 DIAGNOSIS — F411 Generalized anxiety disorder: Secondary | ICD-10-CM

## 2019-04-11 DIAGNOSIS — M797 Fibromyalgia: Secondary | ICD-10-CM | POA: Diagnosis not present

## 2019-04-11 DIAGNOSIS — Z131 Encounter for screening for diabetes mellitus: Secondary | ICD-10-CM

## 2019-04-11 DIAGNOSIS — R569 Unspecified convulsions: Secondary | ICD-10-CM | POA: Diagnosis not present

## 2019-04-11 DIAGNOSIS — I10 Essential (primary) hypertension: Secondary | ICD-10-CM

## 2019-04-11 DIAGNOSIS — F259 Schizoaffective disorder, unspecified: Secondary | ICD-10-CM | POA: Diagnosis not present

## 2019-04-11 DIAGNOSIS — G40909 Epilepsy, unspecified, not intractable, without status epilepticus: Secondary | ICD-10-CM

## 2019-04-11 DIAGNOSIS — Z130 Encounter for screening for diseases of the blood and blood-forming organs and certain disorders involving the immune mechanism: Secondary | ICD-10-CM

## 2019-04-11 MED ORDER — PANTOPRAZOLE SODIUM 40 MG PO TBEC: 40.0000 mg | DELAYED_RELEASE_TABLET | Freq: Every day | ORAL | 0 refills | Status: DC

## 2019-04-11 MED ORDER — GABAPENTIN 800 MG PO TABS: 800.0000 mg | ORAL_TABLET | Freq: Three times a day (TID) | ORAL | 0 refills | Status: AC

## 2019-04-11 MED ORDER — LAMOTRIGINE 100 MG PO TABS: ORAL_TABLET | ORAL | 11 refills | Status: AC

## 2019-04-11 MED ORDER — AMLODIPINE BESYLATE 5 MG PO TABS: 5.0000 mg | ORAL_TABLET | Freq: Every day | ORAL | 0 refills | Status: DC

## 2019-04-11 MED ORDER — ARIPIPRAZOLE 20 MG PO TABS: ORAL_TABLET | ORAL | 0 refills | Status: DC

## 2019-04-11 MED ORDER — HYDROXYZINE HCL 50 MG PO TABS: 50.0000 mg | ORAL_TABLET | Freq: Three times a day (TID) | ORAL | 0 refills | Status: AC | PRN

## 2019-04-11 NOTE — Progress Notes (Signed)
PATIENT: Shannon Castillo DOB: 1980-08-20  Chief Complaint  Patient presents with  . Seizures    She is here with her husband, [name].  Reports her first seizure at age 38, right after having surgery for AVM.  She is currently taking Lamictal 100mg  BID.  Her last seizure occurred two weeks ago.  Denies missing any doses of Lamictal around the time of this event.  PCP    Marland Kitchen, NP     HISTORICAL  Shannon Castillo is a 38 year old female, seen in request by her primary care nurse practitioner 20 for evaluation of seizure, she is accompanied by her husband at today's visit on Oct 26th 2020.  I have reviewed and summarized the referring note from the referring physician.  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  she presented with frequent headaches, and stroke at that time.  Surgery was done at [facility].  She began to have seizure postsurgically, has been treated with lamotrigine since, was on higher dose 150 mg twice a day, she rarely has seizures, the dosage was decreased to 100 mg twice a day around 2018, on average, she has seizure once or twice each year, most recent one was April 07, 2019, she has prodrome of feeling tornado in her brain, then passout generalized tonic-clonic seizure.  She is also on polypharmacy including Abilify 2 mg daily, gabapentin 800 mg 3 times a day, hydroxyzine 50 mg 3 times a day as needed, Protonix 20 mg daily, she stated she has lost her prescription 4 days ago, she feeling really anxious now, hope to get a refill of her prescription  I personally reviewed CT head without contrast April 2019, previous AVM repair, extensive right temporal encephalomalacia  REVIEW OF SYSTEMS: Full 14 system review of systems performed and notable only for as above All other review of systems were negative.  ALLERGIES: Allergies  Allergen Reactions  . Penicillins  Anaphylaxis    Has patient had a PCN reaction causing immediate rash, facial/tongue/throat swelling, SOB or lightheadedness with hypotension: Yes Has patient had a PCN reaction causing severe rash involving mucus membranes or skin necrosis: No Has patient had a PCN reaction that required hospitalization: No Has patient had a PCN reaction occurring within the last 10 years: No If all of the above answers are "NO", then may proceed with Cephalosporin use.    HOME MEDICATIONS: Current Outpatient Medications  Medication Sig Dispense Refill  . amLODipine (NORVASC) 5 MG tablet Take 1 tablet (5 mg total) by mouth daily. 30 tablet 1  . ARIPiprazole (ABILIFY) 30 MG tablet Take 1 tablet (30 mg total) by mouth daily. 30 tablet 0  . Blood Pressure Monitor DEVI Please provide patient with insurance approved blood pressure monitor 1 Device 0  . butalbital-acetaminophen-caffeine (FIORICET) 50-325-40 MG tablet Take 1 tablet by mouth every 6 (six) hours as needed for headache. 14 tablet 0  . hydrOXYzine (ATARAX/VISTARIL) 50 MG tablet Take 1 tablet (50 mg total) by mouth every 6 (six) hours as needed for anxiety. 90 tablet 0  . lamoTRIgine (LAMICTAL) 100 MG tablet Take 1 tablet (100 mg total) by mouth 2 (two) times daily. 60 tablet 1  . pantoprazole (PROTONIX) 20 MG tablet Take 1 tablet (20 mg total) by mouth daily. 30 tablet 0  . rOPINIRole (REQUIP) 1 MG tablet Take 1 mg by mouth at bedtime.  2  . gabapentin (NEURONTIN) 800  MG tablet Take 1 tablet (800 mg total) by mouth 3 (three) times daily. 90 tablet 3   No current facility-administered medications for this visit.     PAST MEDICAL HISTORY: Past Medical History:  Diagnosis Date  . Asthma   . AVM (arteriovenous malformation) brain   . Bipolar disorder ([facility])   . Fibromyalgia   . Hypertension   . Migraine headache   . Schizoaffective disorder ([facility])   . Seizures ([facility])   . Stroke [facility])    x2    PAST SURGICAL HISTORY: Past Surgical History:   Procedure Laterality Date  . BRAIN SURGERY      FAMILY HISTORY: Family History  Problem Relation Age of Onset  . Brain cancer Mother   . Hypertension Father   . Diabetes Maternal Grandmother     SOCIAL HISTORY: Social History   Socioeconomic History  . Marital status: Divorced    Spouse name: Not on file  . Number of children: 4  . Years of education: 9th  . Highest education level: Not on file  Occupational History  . Occupation: Disabled  Social Needs  . Financial resource strain: Not on file  . Food insecurity    Worry: Not on file    Inability: Not on file  . Transportation needs    Medical: Not on file    Non-medical: Not on file  Tobacco Use  . Smoking status: Current Every Day Smoker    Packs/day: 0.50    Types: Cigarettes  . Smokeless tobacco: Never Used  Substance and Sexual Activity  . Alcohol use: Not Currently  . Drug use: Not Currently    Types: Marijuana  . Sexual activity: Not on file  Lifestyle  . Physical activity    Days per week: Not on file    Minutes per session: Not on file  . Stress: Not on file  Relationships  . Social Musicianconnections    Talks on phone: Not on file    Gets together: Not on file    Attends religious service: Not on file    Active member of club or organization: Not on file    Attends meetings of clubs or organizations: Not on file    Relationship status: Not on file  . Intimate partner violence    Fear of current or ex partner: Not on file    Emotionally abused: Not on file    Physically abused: Not on file    Forced sexual activity: Not on file  Other Topics Concern  . Not on file  Social History Narrative   Lives at home with her husband.   Right-handed.   1-2 -- 2L bottles of Dr. Reino KentPepper per day, occasional tea.     PHYSICAL EXAM   Vitals:   04/11/19 1449  BP: (!) 151/94  Pulse: 96  Temp: (!) 97.1 F (36.2 C)  Weight: 172 lb (78 kg)  Height: 5\' 4"  (1.626 m)    Not recorded      Body mass index is  29.52 kg/m.  PHYSICAL EXAMNIATION:  Gen: NAD, conversant, well nourised, well groomed                     Cardiovascular: Regular rate rhythm, no peripheral edema, warm, nontender. Eyes: Conjunctivae clear without exudates or hemorrhage Neck: Supple, no carotid bruits. Pulmonary: Clear to auscultation bilaterally   NEUROLOGICAL EXAM:  MENTAL STATUS: Speech:    Speech is normal; fluent and spontaneous with normal comprehension.  Cognition:  Orientation to time, place and person     Normal recent and remote memory     Normal Attention span and concentration     Normal Language, naming, repeating,spontaneous speech     Fund of knowledge   CRANIAL NERVES: CN II: Visual fields are full to confrontation.  Pupils are round equal and briskly reactive to light. CN III, IV, VI: extraocular movement are normal. No ptosis. CN V: Facial sensation is intact to pinprick in all 3 divisions bilaterally. Corneal responses are intact.  CN VII: Face is symmetric with normal eye closure and smile. CN VIII: Hearing is normal to causal conversation. CN IX, X: Palate elevates symmetrically. Phonation is normal. CN XI: Head turning and shoulder shrug are intact CN XII: Tongue is midline with normal movements and no atrophy.  MOTOR: There is no pronator drift of out-stretched arms. Muscle bulk and tone are normal. Muscle strength is normal.  REFLEXES: Reflexes are 2+ and symmetric at the biceps, triceps, knees, and ankles. Plantar responses are flexor.  SENSORY: Intact to light touch, pinprick, positional sensation and vibratory sensation are intact in fingers and toes.  COORDINATION: Rapid alternating movements and fine finger movements are intact. There is no dysmetria on finger-to-nose and heel-knee-shin.    GAIT/STANCE: Posture is normal. Gait is steady with normal steps, base, arm swing, and turning. Heel and toe walking are normal. Tandem gait is normal.  Romberg is absent.    DIAGNOSTIC DATA (LABS, IMAGING, TESTING) - I reviewed patient records, labs, notes, testing and imaging myself where available.   ASSESSMENT AND PLAN  Tawnya Pujol is a 38 y.o. female   History of right AVM repair, extensive right temporal encephalomalacia Seizure  EEG  Increase lamotrigine to 100 mg /200mg , keep hydroxyzine 50 mg 3 times a day, gabapentin 800 mg 3 times a day, Abilify 30 mg daily, Norvasc 5 mg daily   Marcial Pacas, M.D. Ph.D.  [facility] [address] [place], [place] [address] Ph: [phone] Fax: [phone]  CC: Gildardo Pounds, NP

## 2019-04-13 LAB — LIPID PANEL
Chol/HDL Ratio: 5.2 ratio — ABNORMAL HIGH (ref 0.0–4.4)
Cholesterol, Total: 192 mg/dL (ref 100–199)
HDL: 37 mg/dL — ABNORMAL LOW (ref >39)
LDL Chol Calc (NIH): 127 mg/dL — ABNORMAL HIGH (ref 0–99)
Triglycerides: 158 mg/dL — ABNORMAL HIGH (ref 0–149)
VLDL Cholesterol Cal: 28 mg/dL (ref 5–40)

## 2019-04-13 LAB — TSH: TSH: 3.92 u[IU]/mL (ref 0.450–4.500)

## 2019-04-13 LAB — CMP14+EGFR
ALT: 24 IU/L (ref 0–32)
AST: 16 IU/L (ref 0–40)
Albumin/Globulin Ratio: 2.2 (ref 1.2–2.2)
Albumin: 4.4 g/dL (ref 3.8–4.8)
Alkaline Phosphatase: 111 IU/L (ref 39–117)
BUN/Creatinine Ratio: 8 — ABNORMAL LOW (ref 9–23)
BUN: 7 mg/dL (ref 6–20)
Bilirubin Total: 0.2 mg/dL (ref 0.0–1.2)
CO2: 22 mmol/L (ref 20–29)
Calcium: 9.7 mg/dL (ref 8.7–10.2)
Chloride: 100 mmol/L (ref 96–106)
Creatinine, Ser: 0.9 mg/dL (ref 0.57–1.00)
GFR calc Af Amer: 94 mL/min/{1.73_m2} (ref >59)
GFR calc non Af Amer: 81 mL/min/{1.73_m2} (ref >59)
Globulin, Total: 2 g/dL (ref 1.5–4.5)
Glucose: 94 mg/dL (ref 65–99)
Potassium: 4.3 mmol/L (ref 3.5–5.2)
Sodium: 138 mmol/L (ref 134–144)
Total Protein: 6.4 g/dL (ref 6.0–8.5)

## 2019-04-13 LAB — HEMOGLOBIN A1C
Est. average glucose Bld gHb Est-mCnc: 94 mg/dL
Hgb A1c MFr Bld: 4.9 % (ref 4.8–5.6)

## 2019-04-13 LAB — CBC
Hematocrit: 41.6 % (ref 34.0–46.6)
Hemoglobin: 14.1 g/dL (ref 11.1–15.9)
MCH: 28.9 pg (ref 26.6–33.0)
MCHC: 33.9 g/dL (ref 31.5–35.7)
MCV: 85 fL (ref 79–97)
Platelets: 448 10*3/uL (ref 150–450)
RBC: 4.88 x10E6/uL (ref 3.77–5.28)
RDW: 12.6 % (ref 11.7–15.4)
WBC: 8.5 10*3/uL (ref 3.4–10.8)

## 2019-04-13 LAB — LAMOTRIGINE LEVEL: Lamotrigine Lvl: <1 ug/mL — ABNORMAL LOW (ref 2.0–20.0)

## 2019-04-14 NOTE — Progress Notes (Signed)
Patient notified of results/recommendations.

## 2019-05-09 ENCOUNTER — Other Ambulatory Visit: Payer: Medicaid Other

## 2019-05-25 ENCOUNTER — Other Ambulatory Visit: Payer: Medicaid Other

## 2019-06-01 NOTE — Progress Notes (Signed)
PATIENT: Shannon Castillo DOB: 11/15/1980  REASON FOR VISIT: follow up HISTORY FROM: patient  HISTORY OF PRESENT ILLNESS: Today 06/02/19  HISTORY   Shannon Castillo is a 38 year old female, seen in request by her primary care nurse practitioner Bertram Denver for evaluation of seizure, she is accompanied by her husband at today's visit on Oct 26th 2020.  I have reviewed and summarized the referring note from the referring physician.  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  she presented with frequent headaches, and stroke at that time.  Surgery was done at [facility].  She began to have seizure postsurgically, has been treated with lamotrigine since, was on higher dose 150 mg twice a day, she rarely has seizures, the dosage was decreased to 100 mg twice a day around 2018, on average, she has seizure once or twice each year, most recent one was April 07, 2019, she has prodrome of feeling tornado in her brain, then passout generalized tonic-clonic seizure.  She is also on polypharmacy including Abilify 2 mg daily, gabapentin 800 mg 3 times a day, hydroxyzine 50 mg 3 times a day as needed, Protonix 20 mg daily, she stated she has lost her prescription 4 days ago, she feeling really anxious now, hope to get a refill of her prescription  I personally reviewed CT head without contrast April 2019, previous AVM repair, extensive right temporal encephalomalacia  Update June 02, 2019 SS: She presents today for follow-up, accompanied by her friend.  Since last seen, her husband passed away unexpectedly.  She is tolerating the higher dose of Lamictal 100 mg in the morning, 200 mg at bedtime.  She says she has not had recurrent seizure.  She says she may have preseizure feeling of " tornado in the brain", happening 1-2 times a week.  She says she has chronically had this sensation.  She does report headaches, history of  migraines, about 1 per week.  She is asking for refill on her medications, including Fioricet.  She has not established with a primary doctor.  She reports being under more stress lately with the loss of her husband.  She does not drive a car.  04/11/2019, Lamictal level was less than 1, A1c 4.9, TSH 3.9, CBC, CMP were normal  REVIEW OF SYSTEMS: Out of a complete 14 system review of symptoms, the patient complains only of the following symptoms, and all other reviewed systems are negative.  Seizures, headace  ALLERGIES: Allergies  Allergen Reactions  . Penicillins Anaphylaxis    Has patient had a PCN reaction causing immediate rash, facial/tongue/throat swelling, SOB or lightheadedness with hypotension: Yes Has patient had a PCN reaction causing severe rash involving mucus membranes or skin necrosis: No Has patient had a PCN reaction that required hospitalization: No Has patient had a PCN reaction occurring within the last 10 years: No If all of the above answers are "NO", then may proceed with Cephalosporin use.    HOME MEDICATIONS: Outpatient Medications Prior to Visit  Medication Sig Dispense Refill  . amLODipine (NORVASC) 5 MG tablet Take 1 tablet (5 mg total) by mouth daily. 30 tablet 0  . ARIPiprazole (ABILIFY) 20 MG tablet 1 and 1/2 tab bid 90 tablet 0  . Blood Pressure Monitor DEVI Please provide patient with insurance approved blood pressure monitor 1 Device 0  . butalbital-acetaminophen-caffeine (FIORICET) 50-325-40 MG tablet Take 1 tablet by mouth every 6 (six) hours as needed  for headache. 14 tablet 0  . lamoTRIgine (LAMICTAL) 100 MG tablet 1 in am, 2 tabs at pm 90 tablet 11  . Melatonin 1 MG CAPS Take by mouth. Nightly OTC    . pantoprazole (PROTONIX) 40 MG tablet Take 1 tablet (40 mg total) by mouth daily. 30 tablet 0  . Potassium 75 MG TABS Take by mouth. OTC    . gabapentin (NEURONTIN) 800 MG tablet Take 1 tablet (800 mg total) by mouth 3 (three) times daily. 90 tablet 0    No facility-administered medications prior to visit.    PAST MEDICAL HISTORY: Past Medical History:  Diagnosis Date  . Asthma   . AVM (arteriovenous malformation) brain   . Bipolar disorder (HCC)   . Fibromyalgia   . Hypertension   . Migraine headache   . Schizoaffective disorder (HCC)   . Seizures (HCC)   . Stroke [facility](HCC)    x2    PAST SURGICAL HISTORY: Past Surgical History:  Procedure Laterality Date  . BRAIN SURGERY      FAMILY HISTORY: Family History  Problem Relation Age of Onset  . Brain cancer Mother   . Hypertension Father   . Diabetes Maternal Grandmother     SOCIAL HISTORY: Social History   Socioeconomic History  . Marital status: Divorced    Spouse name: Not on file  . Number of children: 4  . Years of education: 9th  . Highest education level: Not on file  Occupational History  . Occupation: Disabled  Tobacco Use  . Smoking status: Current Every Day Smoker    Packs/day: 0.50    Types: Cigarettes  . Smokeless tobacco: Never Used  Substance and Sexual Activity  . Alcohol use: Not Currently  . Drug use: Not Currently    Types: Marijuana  . Sexual activity: Not on file  Other Topics Concern  . Not on file  Social History Narrative   Lives at home with her husband.   Right-handed.   1-2 -- 2L bottles of Dr. Reino KentPepper per day, occasional tea.   Social Determinants of Health   Financial Resource Strain:   . Difficulty of Paying Living Expenses: Not on file  Food Insecurity:   . Worried About Programme researcher, broadcasting/film/videounning Out of Food in the Last Year: Not on file  . Ran Out of Food in the Last Year: Not on file  Transportation Needs:   . Lack of Transportation (Medical): Not on file  . Lack of Transportation (Non-Medical): Not on file  Physical Activity:   . Days of Exercise per Week: Not on file  . Minutes of Exercise per Session: Not on file  Stress:   . Feeling of Stress : Not on file  Social Connections:   . Frequency of Communication with Friends and  Family: Not on file  . Frequency of Social Gatherings with Friends and Family: Not on file  . Attends Religious Services: Not on file  . Active Member of Clubs or Organizations: Not on file  . Attends BankerClub or Organization Meetings: Not on file  . Marital Status: Not on file  Intimate Partner Violence:   . Fear of Current or Ex-Partner: Not on file  . Emotionally Abused: Not on file  . Physically Abused: Not on file  . Sexually Abused: Not on file   PHYSICAL EXAM  Vitals:   06/02/19 1531  BP: 119/83  Pulse: 85  Temp: (!) 97.2 F (36.2 C)  Weight: 177 lb (80.3 kg)  Height: 5' 4.75" (1.645 m)  Body mass index is 29.68 kg/m.  Generalized: Well developed, in no acute distress   Neurological examination  Mentation: Alert oriented to time, place, history taking. Follows all commands speech and language fluent Cranial nerve II-XII: Pupils were equal round reactive to light. Extraocular movements were full, visual field were full on confrontational test. Facial sensation and strength were normal. Head turning and shoulder shrug  were normal and symmetric. Motor: The motor testing reveals 5 over 5 strength of all 4 extremities. Good symmetric motor tone is noted throughout.  Sensory: Sensory testing is intact to soft touch on all 4 extremities. No evidence of extinction is noted.  Coordination: Cerebellar testing reveals good finger-nose-finger and heel-to-shin bilaterally.  Gait and station: Gait is normal. Tandem gait is mildly unsteady. Reflexes: Deep tendon reflexes are symmetric and normal bilaterally.   DIAGNOSTIC DATA (LABS, IMAGING, TESTING) - I reviewed patient records, labs, notes, testing and imaging myself where available.  Lab Results  Component Value Date   WBC 8.5 04/11/2019   HGB 14.1 04/11/2019   HCT 41.6 04/11/2019   MCV 85 04/11/2019   PLT 448 04/11/2019      Component Value Date/Time   NA 138 04/11/2019 1547   K 4.3 04/11/2019 1547   CL 100 04/11/2019  1547   CO2 22 04/11/2019 1547   GLUCOSE 94 04/11/2019 1547   GLUCOSE 96 12/25/2017 0935   BUN 7 04/11/2019 1547   CREATININE 0.90 04/11/2019 1547   CALCIUM 9.7 04/11/2019 1547   PROT 6.4 04/11/2019 1547   ALBUMIN 4.4 04/11/2019 1547   AST 16 04/11/2019 1547   ALT 24 04/11/2019 1547   ALKPHOS 111 04/11/2019 1547   BILITOT 0.2 04/11/2019 1547   GFRNONAA 81 04/11/2019 1547   GFRAA 94 04/11/2019 1547   Lab Results  Component Value Date   CHOL 192 04/11/2019   HDL 37 (L) 04/11/2019   LDLCALC 127 (H) 04/11/2019   TRIG 158 (H) 04/11/2019   CHOLHDL 5.2 (H) 04/11/2019   Lab Results  Component Value Date   HGBA1C 4.9 04/11/2019   No results found for: [id number] Lab Results  Component Value Date   TSH 3.920 04/11/2019      ASSESSMENT AND PLAN 38 y.o. year old female  has a past medical history of Asthma, AVM (arteriovenous malformation) brain, Bipolar disorder ([place]), Fibromyalgia, Hypertension, Migraine headache, Schizoaffective disorder ([place]), Seizures ([place]), and Stroke ([place]). here with:  1.  History of right AVM repair, extensive right temporal encephalopathy 2.  Seizure -EEG has yet to be completed -She has not had recurrent seizure, has had feeling of pre-seizure, " tornado in the brain" but has had these chronically, has been under more stress with the unexpected loss of her husband -Continue Lamictal 100 mg in the morning, 200 mg at bedtime, we did discuss increasing the dose due to pre-seizure sensation, but she wants to hold off for now -She needs to get a primary care doctor, I have placed a referral for the [facility] -I will refill her maintenance medications including hydroxyzine, gabapentin, Abilify, Norvasc, Protonix, she needs primary care for future refills -I will refill Fioricet, only 14 tablets for acute headache, if her headaches continue to be a concern, will need to consider preventative medication -She will call for recurrent seizure,  follow-up in 6 months or sooner if needed  I spent 15 minutes with the patient. 50% of this time was spent discussing her plan of care.   Butler Denmark, AGNP-C, DNP 06/02/2019,  3:44 PM [facility] [address], [address] [place], [place] [address] [phone]

## 2019-06-02 ENCOUNTER — Ambulatory Visit: Payer: Medicaid Other | Admitting: Neurology

## 2019-06-02 ENCOUNTER — Encounter: Payer: Self-pay | Admitting: Neurology

## 2019-06-02 ENCOUNTER — Other Ambulatory Visit: Payer: Self-pay

## 2019-06-02 VITALS — BP 119/83 | HR 85 | Temp 97.2°F | Ht 64.75 in | Wt 177.0 lb

## 2019-06-02 DIAGNOSIS — I10 Essential (primary) hypertension: Secondary | ICD-10-CM

## 2019-06-02 DIAGNOSIS — F259 Schizoaffective disorder, unspecified: Secondary | ICD-10-CM

## 2019-06-02 DIAGNOSIS — R569 Unspecified convulsions: Secondary | ICD-10-CM

## 2019-06-02 MED ORDER — AMLODIPINE BESYLATE 5 MG PO TABS: 5.0000 mg | ORAL_TABLET | Freq: Every day | ORAL | 0 refills | Status: AC

## 2019-06-02 MED ORDER — PANTOPRAZOLE SODIUM 40 MG PO TBEC: 40.0000 mg | DELAYED_RELEASE_TABLET | Freq: Every day | ORAL | 0 refills | Status: AC

## 2019-06-02 MED ORDER — BUTALBITAL-APAP-CAFFEINE 50-325-40 MG PO TABS: 1.0000 | ORAL_TABLET | Freq: Four times a day (QID) | ORAL | 0 refills | Status: AC | PRN

## 2019-06-02 MED ORDER — ARIPIPRAZOLE 20 MG PO TABS: ORAL_TABLET | ORAL | 0 refills | Status: AC

## 2019-06-02 MED ORDER — HYDROXYZINE HCL 50 MG PO TABS: 50.0000 mg | ORAL_TABLET | Freq: Three times a day (TID) | ORAL | 0 refills | Status: AC | PRN

## 2019-06-02 NOTE — Patient Instructions (Signed)
Continue current medications, I have sent refills   Please follow-up with PCP   Return in 6 months or sooner if needed

## 2019-06-08 ENCOUNTER — Other Ambulatory Visit: Payer: Medicaid Other

## 2019-06-27 ENCOUNTER — Encounter: Payer: Self-pay | Admitting: Neurology

## 2019-06-27 ENCOUNTER — Encounter (HOSPITAL_COMMUNITY): Payer: Self-pay

## 2019-06-27 ENCOUNTER — Ambulatory Visit (HOSPITAL_COMMUNITY)
Admission: EM | Admit: 2019-06-27 | Discharge: 2019-06-27 | Disposition: A | Discharge status: Home / Self Care | Payer: Medicaid Other | Attending: Family Medicine | Admitting: Family Medicine

## 2019-06-27 ENCOUNTER — Other Ambulatory Visit: Payer: Self-pay

## 2019-06-27 ENCOUNTER — Other Ambulatory Visit: Payer: Medicaid Other

## 2019-06-27 DIAGNOSIS — Z720 Tobacco use: Secondary | ICD-10-CM

## 2019-06-27 DIAGNOSIS — J4541 Moderate persistent asthma with (acute) exacerbation: Secondary | ICD-10-CM | POA: Diagnosis not present

## 2019-06-27 DIAGNOSIS — J22 Unspecified acute lower respiratory infection: Secondary | ICD-10-CM | POA: Diagnosis not present

## 2019-06-27 MED ORDER — PREDNISONE 20 MG PO TABS: 20.0000 mg | ORAL_TABLET | Freq: Every day | ORAL | 0 refills | Status: AC

## 2019-06-27 MED ORDER — ALBUTEROL SULFATE HFA 108 (90 BASE) MCG/ACT IN AERS: 1.0000 | INHALATION_SPRAY | Freq: Four times a day (QID) | RESPIRATORY_TRACT | 0 refills | Status: AC | PRN

## 2019-06-27 MED ORDER — ALBUTEROL SULFATE (2.5 MG/3ML) 0.083% IN NEBU: 2.5000 mg | INHALATION_SOLUTION | Freq: Four times a day (QID) | RESPIRATORY_TRACT | 0 refills | Status: AC | PRN

## 2019-06-27 MED ORDER — AZITHROMYCIN 250 MG PO TABS: ORAL_TABLET | ORAL | 0 refills | Status: AC

## 2019-06-27 NOTE — ED Provider Notes (Signed)
[facility]    CSN: [id number] Arrival date & time: 06/27/19  1423      History   Chief Complaint Chief Complaint  Patient presents with  . Nasal Congestion    HPI Shannon Castillo is a 39 y.o. female.   HPI  39 year old female, longstanding history of asthma.  Uses Symbicort twice a day.  Is out of her albuterol.  States she is also a longstanding cigarette smoker and has been told she might have some COPD.  Is here for coughing for 3 weeks.  She states that been going on for 3 weeks.  She feels certain that it smelled coronavirus and refuses coronavirus testing.  She has been using the Symbicort.  She has been trying to drink enough fluids.  Her appetite is poor but her taste and smell seem normal.  No fever or chills.  No body aches.  She is slightly tired.  She states she is having a lot of coughing coughing up yellow sputum.  She is also wheezing and short of breath with exertion. Medical history is reviewed Allergies are reviewed.  Patient states that she had an immediate reaction to penicillin as a child when her throat swelled up.  She states she is never taken penicillins   Past Medical History:  Diagnosis Date  . Asthma   . AVM (arteriovenous malformation) brain   . Bipolar disorder (HCC)   . Fibromyalgia   . Hypertension   . Migraine headache   . Schizoaffective disorder (HCC)   . Seizures (HCC)   . Stroke [facility])    x2    Patient Active Problem List   Diagnosis Date Noted  . Seizures (HCC) 04/11/2019  . Seizure disorder (HCC) 02/16/2019  . Essential hypertension 02/16/2019  . Fibromyalgia 02/16/2019  . Anxiety state 02/16/2019  . Schizoaffective disorder (HCC) 02/16/2019    Past Surgical History:  Procedure Laterality Date  . BRAIN SURGERY      OB History   No obstetric history on file.      Home Medications    Prior to Admission medications   Medication Sig Start Date End Date Taking? Authorizing Provider  budesonide-formoterol  (SYMBICORT) 160-4.5 MCG/ACT inhaler Inhale 2 puffs into the lungs 2 (two) times daily.   Yes [provider]  albuterol (PROVENTIL) (2.5 MG/3ML) 0.083% nebulizer solution Take 3 mLs (2.5 mg total) by nebulization every 6 (six) hours as needed for wheezing or shortness of breath. 06/27/19   Eustace Moore, MD  albuterol (VENTOLIN HFA) 108 (90 Base) MCG/ACT inhaler Inhale 1-2 puffs into the lungs every 6 (six) hours as needed for wheezing or shortness of breath. 06/27/19   Eustace Moore, MD  amLODipine (NORVASC) 5 MG tablet Take 1 tablet (5 mg total) by mouth daily. 06/02/19 08/01/19  Glean Salvo, NP  ARIPiprazole (ABILIFY) 20 MG tablet 1 and 1/2 tab bid 06/02/19   Glean Salvo, NP  azithromycin (ZITHROMAX Z-PAK) 250 MG tablet Take two pills today followed by one a day until gone 06/27/19   Eustace Moore, MD  Blood Pressure Monitor DEVI Please provide patient with insurance approved blood pressure monitor 02/16/19   Claiborne Rigg, NP  butalbital-acetaminophen-caffeine (FIORICET) [phone] MG tablet Take 1 tablet by mouth every 6 (six) hours as needed for headache. 06/02/19   Glean Salvo, NP  gabapentin (NEURONTIN) 800 MG tablet Take 1 tablet (800 mg total) by mouth 3 (three) times daily. 04/11/19 05/11/19  Levert Feinstein, MD  hydrOXYzine (  ATARAX/VISTARIL) 50 MG tablet Take 1 tablet (50 mg total) by mouth 3 (three) times daily as needed. 06/02/19   Glean Salvo, NP  lamoTRIgine (LAMICTAL) 100 MG tablet 1 in am, 2 tabs at pm 04/11/19   Levert Feinstein, MD  Melatonin 1 MG CAPS Take by mouth. Nightly OTC    [provider]  pantoprazole (PROTONIX) 40 MG tablet Take 1 tablet (40 mg total) by mouth daily. 06/02/19   Glean Salvo, NP  Potassium 75 MG TABS Take by mouth. OTC    [provider]  predniSONE (DELTASONE) 20 MG tablet Take 1 tablet (20 mg total) by mouth daily with breakfast. 06/27/19   Eustace Moore, MD    Family History Family History  Problem  Relation Age of Onset  . Brain cancer Mother   . Hypertension Father   . Diabetes Maternal Grandmother     Social History Social History   Tobacco Use  . Smoking status: Current Every Day Smoker    Packs/day: 0.50    Types: Cigarettes  . Smokeless tobacco: Never Used  Substance Use Topics  . Alcohol use: Not Currently  . Drug use: Not Currently    Types: Marijuana     Allergies   Penicillins   Review of Systems Review of Systems  Constitutional: Positive for appetite change and fever. Negative for chills and fatigue.  HENT: Positive for congestion and rhinorrhea.        ImProving  Respiratory: Positive for cough, chest tightness, shortness of breath and wheezing.   Cardiovascular: Negative for chest pain.  Gastrointestinal: Negative for nausea and vomiting.  Neurological: Negative for dizziness and headaches.     Physical Exam Triage Vital Signs ED Triage Vitals  Enc Vitals Group     BP 06/27/19 1548 120/79     Pulse Rate 06/27/19 1548 84     Resp 06/27/19 1548 19     Temp 06/27/19 1548 (!) 97.5 F (36.4 C)     Temp Source 06/27/19 1548 Oral     SpO2 06/27/19 1548 97 %     Weight --      Height --      Head Circumference --      Peak Flow --      Pain Score 06/27/19 1546 0     Pain Loc --      Pain Edu? --      Excl. in GC? --    No data found.  Updated Vital Signs BP 120/79 (BP Location: Right Arm)   Pulse 84   Temp (!) 97.5 F (36.4 C) (Oral)   Resp 19   SpO2 97%     Physical Exam Constitutional:      General: She is not in acute distress.    Appearance: She is well-developed.  HENT:     Head: Normocephalic and atraumatic.     Right Ear: There is impacted cerumen.     Left Ear: Tympanic membrane, ear canal and external ear normal.     Nose: Nose normal. No congestion.     Mouth/Throat:     Mouth: Mucous membranes are moist.     Pharynx: No posterior oropharyngeal erythema.     Comments: Edentulous Eyes:     Conjunctiva/sclera:  Conjunctivae normal.     Pupils: Pupils are equal, round, and reactive to light.  Cardiovascular:     Rate and Rhythm: Normal rate and regular rhythm.     Heart sounds: Normal heart sounds.  Pulmonary:     Effort: Pulmonary effort is normal. No respiratory distress.     Breath sounds: Wheezing and rhonchi present. No rales.  Abdominal:     General: There is no distension.     Palpations: Abdomen is soft.  Musculoskeletal:        General: Normal range of motion.     Cervical back: Normal range of motion.  Skin:    General: Skin is warm and dry.  Neurological:     Mental Status: She is alert.  Psychiatric:        Mood and Affect: Mood normal.        Behavior: Behavior normal.      UC Treatments / Results  Labs (all labs ordered are listed, but only abnormal results are displayed) Labs Reviewed - No data to display  EKG   Radiology No results found.  Procedures Procedures (including critical care time)  Medications Ordered in UC Medications - No data to display  Initial Impression / Assessment and Plan / UC Course  I have reviewed the triage vital signs and the nursing notes.  Pertinent labs & imaging results that were available during my care of the patient were reviewed by me and considered in my medical decision making (see chart for details).     Recommend Covid testing.  Patient refuses We will treat for exacerbation of asthma/COPD Patient initially refused steroids but agrees to a lower dose.  She states that it makes her too jittery I discussed antibiotic usage and she states that azithromycin works best for her And giving her albuterol inhaler and nebulizer to control her wheezing Strongly recommended to quit smoking Final Clinical Impressions(s) / UC Diagnoses   Final diagnoses:  Moderate persistent asthma with acute exacerbation  Tobacco abuse disorder  LRTI (lower respiratory tract infection)     Discharge Instructions     Make sure you are  drinking plenty of liquids Take the Z-Pak as directed Take prednisone 1 a day for 5 days Use the albuterol nebulizer while at home, as needed for shortness of breath Use the albuterol inhaler when away from home, as needed Continue Symbicort twice a day Follow-up with your primary care doctor   ED Prescriptions    Medication Sig Dispense Auth. Provider   albuterol (VENTOLIN HFA) 108 (90 Base) MCG/ACT inhaler Inhale 1-2 puffs into the lungs every 6 (six) hours as needed for wheezing or shortness of breath. 18 g Raylene Everts, MD   albuterol (PROVENTIL) (2.5 MG/3ML) 0.083% nebulizer solution Take 3 mLs (2.5 mg total) by nebulization every 6 (six) hours as needed for wheezing or shortness of breath. 75 mL Raylene Everts, MD   predniSONE (DELTASONE) 20 MG tablet Take 1 tablet (20 mg total) by mouth daily with breakfast. 5 tablet Raylene Everts, MD   azithromycin (ZITHROMAX Z-PAK) 250 MG tablet Take two pills today followed by one a day until gone 6 tablet Meda Coffee Jennette Banker, MD     PDMP not reviewed this encounter.   Raylene Everts, MD 06/27/19 [phone]

## 2019-06-27 NOTE — ED Triage Notes (Signed)
Patient presents to Urgent Care with complaints of nasal congestion that has moved down into her chest since 2-3 weeks ago. Patient reports she has been using her inhaler at home.

## 2019-06-27 NOTE — Discharge Instructions (Addendum)
Make sure you are drinking plenty of liquids Take the Z-Pak as directed Take prednisone 1 a day for 5 days Use the albuterol nebulizer while at home, as needed for shortness of breath Use the albuterol inhaler when away from home, as needed Continue Symbicort twice a day Follow-up with your primary care doctor

## 2019-12-01 ENCOUNTER — Telehealth: Payer: Self-pay | Admitting: *Deleted

## 2019-12-01 ENCOUNTER — Encounter: Payer: Self-pay | Admitting: Neurology

## 2019-12-01 ENCOUNTER — Ambulatory Visit: Payer: Medicaid Other | Admitting: Neurology

## 2019-12-01 NOTE — Telephone Encounter (Signed)
No showed follow up appointment. 

## 2020-02-11 ENCOUNTER — Other Ambulatory Visit: Payer: Self-pay | Admitting: Neurology

## 2022-05-18 ENCOUNTER — Other Ambulatory Visit: Payer: Self-pay

## 2022-05-18 ENCOUNTER — Emergency Department (HOSPITAL_COMMUNITY): Payer: Medicaid Other

## 2022-05-18 ENCOUNTER — Emergency Department (HOSPITAL_COMMUNITY)
Admission: EM | Admit: 2022-05-18 | Discharge: 2022-05-18 | Disposition: A | Discharge status: Home / Self Care | Payer: Medicaid Other | Attending: Emergency Medicine | Admitting: Emergency Medicine

## 2022-05-18 DIAGNOSIS — J101 Influenza due to other identified influenza virus with other respiratory manifestations: Secondary | ICD-10-CM | POA: Insufficient documentation

## 2022-05-18 DIAGNOSIS — Z20822 Contact with and (suspected) exposure to covid-19: Secondary | ICD-10-CM | POA: Insufficient documentation

## 2022-05-18 DIAGNOSIS — R569 Unspecified convulsions: Secondary | ICD-10-CM

## 2022-05-18 DIAGNOSIS — Z79899 Other long term (current) drug therapy: Secondary | ICD-10-CM | POA: Insufficient documentation

## 2022-05-18 DIAGNOSIS — I10 Essential (primary) hypertension: Secondary | ICD-10-CM | POA: Insufficient documentation

## 2022-05-18 DIAGNOSIS — G40909 Epilepsy, unspecified, not intractable, without status epilepticus: Secondary | ICD-10-CM | POA: Diagnosis not present

## 2022-05-18 LAB — BASIC METABOLIC PANEL
Anion gap: 8 (ref 5–15)
BUN: 10 mg/dL (ref 6–20)
CO2: 20 mmol/L — ABNORMAL LOW (ref 22–32)
Calcium: 9.1 mg/dL (ref 8.9–10.3)
Chloride: 110 mmol/L (ref 98–111)
Creatinine, Ser: 0.95 mg/dL (ref 0.44–1.00)
GFR, Estimated: >60 mL/min (ref >60)
Glucose, Bld: 127 mg/dL — ABNORMAL HIGH (ref 70–99)
Potassium: 3.4 mmol/L — ABNORMAL LOW (ref 3.5–5.1)
Sodium: 138 mmol/L (ref 135–145)

## 2022-05-18 LAB — URINALYSIS, ROUTINE W REFLEX MICROSCOPIC
Bilirubin Urine: NEGATIVE
Glucose, UA: NEGATIVE mg/dL
Ketones, ur: NEGATIVE mg/dL
Leukocytes,Ua: NEGATIVE
Nitrite: NEGATIVE
Protein, ur: 100 mg/dL — AB
Specific Gravity, Urine: >1.03 — ABNORMAL HIGH (ref 1.005–1.030)
pH: 5.5 (ref 5.0–8.0)

## 2022-05-18 LAB — CBC WITH DIFFERENTIAL/PLATELET
Abs Immature Granulocytes: 0.07 10*3/uL (ref 0.00–0.07)
Basophils Absolute: 0 10*3/uL (ref 0.0–0.1)
Basophils Relative: 0 %
Eosinophils Absolute: 0 10*3/uL (ref 0.0–0.5)
Eosinophils Relative: 0 %
HCT: 38.7 % (ref 36.0–46.0)
Hemoglobin: 13 g/dL (ref 12.0–15.0)
Immature Granulocytes: 1 %
Lymphocytes Relative: 3 %
Lymphs Abs: 0.3 10*3/uL — ABNORMAL LOW (ref 0.7–4.0)
MCH: 31 pg (ref 26.0–34.0)
MCHC: 33.6 g/dL (ref 30.0–36.0)
MCV: 92.1 fL (ref 80.0–100.0)
Monocytes Absolute: 0.6 10*3/uL (ref 0.1–1.0)
Monocytes Relative: 5 %
Neutro Abs: 9.9 10*3/uL — ABNORMAL HIGH (ref 1.7–7.7)
Neutrophils Relative %: 91 %
Platelets: 269 10*3/uL (ref 150–400)
RBC: 4.2 MIL/uL (ref 3.87–5.11)
RDW: 12.9 % (ref 11.5–15.5)
WBC: 11 10*3/uL — ABNORMAL HIGH (ref 4.0–10.5)
nRBC: 0 % (ref 0.0–0.2)

## 2022-05-18 LAB — URINALYSIS, MICROSCOPIC (REFLEX): Bacteria, UA: NONE SEEN

## 2022-05-18 LAB — HEPATIC FUNCTION PANEL
ALT: 10 U/L (ref 0–44)
AST: 14 U/L — ABNORMAL LOW (ref 15–41)
Albumin: 3.9 g/dL (ref 3.5–5.0)
Alkaline Phosphatase: 58 U/L (ref 38–126)
Bilirubin, Direct: 0.1 mg/dL (ref 0.0–0.2)
Indirect Bilirubin: 0.4 mg/dL (ref 0.3–0.9)
Total Bilirubin: 0.5 mg/dL (ref 0.3–1.2)
Total Protein: 6.7 g/dL (ref 6.5–8.1)

## 2022-05-18 LAB — RESP PANEL BY RT-PCR (FLU A&B, COVID) ARPGX2
Influenza A by PCR: POSITIVE — AB
Influenza B by PCR: NEGATIVE
SARS Coronavirus 2 by RT PCR: NEGATIVE

## 2022-05-18 LAB — PREGNANCY, URINE: Preg Test, Ur: NEGATIVE

## 2022-05-18 LAB — ETHANOL: Alcohol, Ethyl (B): <10 mg/dL (ref <10)

## 2022-05-18 LAB — MAGNESIUM: Magnesium: 1.8 mg/dL (ref 1.7–2.4)

## 2022-05-18 LAB — CBG MONITORING, ED: Glucose-Capillary: 123 mg/dL — ABNORMAL HIGH (ref 70–99)

## 2022-05-18 MED ORDER — SODIUM CHLORIDE 0.9 % IV BOLUS
500.0000 mL | Freq: Once | INTRAVENOUS | Status: AC
  Administered 2022-05-18: 500 mL via INTRAVENOUS

## 2022-05-18 MED ORDER — LAMOTRIGINE 25 MG PO TABS: 25.0000 mg | ORAL_TABLET | Freq: Every day | ORAL | 0 refills | Status: AC

## 2022-05-18 MED ORDER — IBUPROFEN 200 MG PO TABS
400.0000 mg | ORAL_TABLET | Freq: Once | ORAL | Status: AC
  Administered 2022-05-18: 400 mg via ORAL
  Filled 2022-05-18: qty 2

## 2022-05-18 MED ORDER — ACETAMINOPHEN 325 MG PO TABS
650.0000 mg | ORAL_TABLET | Freq: Once | ORAL | Status: AC
  Administered 2022-05-18: 650 mg via ORAL
  Filled 2022-05-18: qty 2

## 2022-05-18 NOTE — ED Triage Notes (Signed)
Per EMS, pt from home, pt had seizure activity that lasted 1 minute.  Pt has a hx however this was the "biggest one I ever seen."  Pt was slightly postictal when EMS arrived but is now A/O X4. There has been COVID exposure in home, pt has had fever but tested negative for COVID.   Pt does not have a PCP who can write seizure medication.   110/80 CBG 193 HR 110

## 2022-05-18 NOTE — ED Provider Notes (Signed)
[facility]-EMERGENCY DEPT Provider Note   CSN: [id number] Arrival date & time: 05/18/22  0428     History  Chief Complaint  Patient presents with   Seizures    Shannon Castillo is a 41 y.o. female.  The history is provided by the patient, a relative and medical records.  Seizures Shannon Castillo is a 41 y.o. female who presents to the Emergency Department complaining of seizure.  She presents to the emergency department accompanied by her family member for evaluation of possible seizure.  Is a history of seizure disorder and has previously been on Lamictal 100 mg daily but has been off of this medication for 3 years.  Last major seizure was 3 years ago.  She has been experiencing what she feels like is about to be a seizure but has been able to stop it since being off the medication but today she had what is described as a generalized seizure with shaking, kicking of her feet, foaming at her mouth and incoherent episode.  This lasted for about 1 minute.  No loss of bowel or bladder.  No tongue biting.  She also reports subjective fevers for 3 days with 1 week of cough, diarrhea and no vomiting.    Has a hx/o HTN, brain surgery for AVM.   Not currently on meds.   Subjective fever for three days.  Cough for one week.   Has strong urine but no dysuria.      Home Medications Prior to Admission medications   Medication Sig Start Date End Date Taking? Authorizing Provider  lamoTRIgine (LAMICTAL) 25 MG tablet Take 1 tablet (25 mg total) by mouth daily. 05/18/22  Yes Tilden Fossa, MD  albuterol (PROVENTIL) (2.5 MG/3ML) 0.083% nebulizer solution Take 3 mLs (2.5 mg total) by nebulization every 6 (six) hours as needed for wheezing or shortness of breath. 06/27/19   Eustace Moore, MD  albuterol (VENTOLIN HFA) 108 (90 Base) MCG/ACT inhaler Inhale 1-2 puffs into the lungs every 6 (six) hours as needed for wheezing or shortness of breath. 06/27/19   Eustace Moore, MD   amLODipine (NORVASC) 5 MG tablet Take 1 tablet (5 mg total) by mouth daily. 06/02/19 08/01/19  Glean Salvo, NP  ARIPiprazole (ABILIFY) 20 MG tablet 1 and 1/2 tab bid 06/02/19   Glean Salvo, NP  azithromycin (ZITHROMAX Z-PAK) 250 MG tablet Take two pills today followed by one a day until gone 06/27/19   Eustace Moore, MD  Blood Pressure Monitor DEVI Please provide patient with insurance approved blood pressure monitor 02/16/19   Claiborne Rigg, NP  budesonide-formoterol [facility]) 160-4.5 MCG/ACT inhaler Inhale 2 puffs into the lungs 2 (two) times daily.    [provider]  butalbital-acetaminophen-caffeine (FIORICET) [phone] MG tablet Take 1 tablet by mouth every 6 (six) hours as needed for headache. 06/02/19   Glean Salvo, NP  gabapentin (NEURONTIN) 800 MG tablet Take 1 tablet (800 mg total) by mouth 3 (three) times daily. 04/11/19 05/11/19  Levert Feinstein, MD  hydrOXYzine (ATARAX/VISTARIL) 50 MG tablet Take 1 tablet (50 mg total) by mouth 3 (three) times daily as needed. 06/02/19   Glean Salvo, NP  lamoTRIgine (LAMICTAL) 100 MG tablet 1 in am, 2 tabs at pm 04/11/19   Levert Feinstein, MD  Melatonin 1 MG CAPS Take by mouth. Nightly OTC    [provider]  pantoprazole (PROTONIX) 40 MG tablet Take 1 tablet (40 mg total) by mouth daily. 06/02/19  Suzzanne Cloud, NP  Potassium 75 MG TABS Take by mouth. OTC    [provider]  predniSONE (DELTASONE) 20 MG tablet Take 1 tablet (20 mg total) by mouth daily with breakfast. 06/27/19   Raylene Everts, MD      Allergies    Penicillins    Review of Systems   Review of Systems  Neurological:  Positive for seizures.  All other systems reviewed and are negative.   Physical Exam Updated Vital Signs BP 126/76 (BP Location: Left Arm)   Pulse 82   Temp 98 F (36.7 C) (Oral)   Resp 15   LMP 05/13/2022   SpO2 95%  Physical Exam Vitals and nursing note reviewed.  Constitutional:      Appearance: She is  well-developed.  HENT:     Head: Normocephalic and atraumatic.  Cardiovascular:     Rate and Rhythm: Normal rate and regular rhythm.     Heart sounds: No murmur heard. Pulmonary:     Effort: Pulmonary effort is normal. No respiratory distress.     Breath sounds: Normal breath sounds.  Abdominal:     Palpations: Abdomen is soft.     Tenderness: There is no abdominal tenderness. There is no guarding or rebound.  Musculoskeletal:        General: No tenderness.  Skin:    General: Skin is warm and dry.  Neurological:     Mental Status: She is alert and oriented to person, place, and time.     Comments: No asymmetry of facial movements, 5/5 strength in all four extremities with sensation to light touch intact in all four extremities.   Psychiatric:        Behavior: Behavior normal.     ED Results / Procedures / Treatments   Labs (all labs ordered are listed, but only abnormal results are displayed) Labs Reviewed  RESP PANEL BY RT-PCR (FLU A&B, COVID) ARPGX2 - Abnormal; Notable for the following components:      Result Value   Influenza A by PCR POSITIVE (*)    All other components within normal limits  CBC WITH DIFFERENTIAL/PLATELET - Abnormal; Notable for the following components:   WBC 11.0 (*)    Neutro Abs 9.9 (*)    Lymphs Abs 0.3 (*)    All other components within normal limits  BASIC METABOLIC PANEL - Abnormal; Notable for the following components:   Potassium 3.4 (*)    CO2 20 (*)    Glucose, Bld 127 (*)    All other components within normal limits  URINALYSIS, ROUTINE W REFLEX MICROSCOPIC - Abnormal; Notable for the following components:   Specific Gravity, Urine >1.030 (*)    Hgb urine dipstick SMALL (*)    Protein, ur 100 (*)    All other components within normal limits  HEPATIC FUNCTION PANEL - Abnormal; Notable for the following components:   AST 14 (*)    All other components within normal limits  CBG MONITORING, ED - Abnormal; Notable for the following  components:   Glucose-Capillary 123 (*)    All other components within normal limits  MAGNESIUM  ETHANOL  PREGNANCY, URINE  URINALYSIS, MICROSCOPIC (REFLEX)    EKG EKG Interpretation  Date/Time:  Sunday May 18 2022 05:18:15 EST Ventricular Rate:  89 PR Interval:  156 QRS Duration: 103 QT Interval:  366 QTC Calculation: 446 R Axis:   39 Text Interpretation: Sinus rhythm Confirmed by Quintella Reichert [phone]) on 05/18/2022 6:15:04 AM  Radiology DG Chest  Port 1 View  Result Date: 05/18/2022 CLINICAL DATA:  Shortness of breath.  Seizure activity EXAM: PORTABLE CHEST 1 VIEW COMPARISON:  08/21/2017 FINDINGS: Normal heart size and mediastinal contours. No acute infiltrate or edema. No effusion or pneumothorax. No acute osseous findings. IMPRESSION: Negative chest. Electronically Signed   By: Jorje Guild M.D.   On: 05/18/2022 06:30    Procedures Procedures    Medications Ordered in ED Medications  sodium chloride 0.9 % bolus 500 mL (0 mLs Intravenous Stopped 05/18/22 0706)  acetaminophen (TYLENOL) tablet 650 mg (650 mg Oral Given 05/18/22 0549)  ibuprofen (ADVIL) tablet 400 mg (400 mg Oral Given 05/18/22 0706)    ED Course/ Medical Decision Making/ A&P                           Medical Decision Making Amount and/or Complexity of Data Reviewed Labs: ordered. Radiology: ordered.  Risk OTC drugs. Prescription drug management.   Pt with hx/o seizures, previously on lamictal here for evaluation of possible seizure.  Pt is nontoxic appearing on evaluation and well hydrated.  She has no focal neurologic deficits.  She is positive for influenza in the emergency department.  Chest x-ray without acute infiltrate.  CBC with mild leukocytosis.  UA not consistent with UTI.  No significant electrolyte abnormality.  Current presentation is not consistent with CVA, space-occupying lesion, sepsis.  Patient is requesting refill on Lamictal.  She has been off this medication for 3 years.   Will restart at 25 mg daily.  Discussed importance of neurology follow-up.  Discussed seizure precautions.  She is not a Tamiflu candidate given duration of symptoms.  Discussed outpatient follow-up and return precautions.        Final Clinical Impression(s) / ED Diagnoses Final diagnoses:  Influenza A  Seizure-like activity ([place])    Rx / DC Orders ED Discharge Orders          Ordered    lamoTRIgine (LAMICTAL) 25 MG tablet  Daily        05/18/22 0701              Quintella Reichert, MD 05/18/22 [phone]

## 2022-05-18 NOTE — ED Notes (Addendum)
Placed seizure pads on the rails
